# Patient Record
Sex: Female | Born: 1990 | Race: Black or African American | Hispanic: No | Marital: Single | Smoking: Never smoker
Health system: Southern US, Community
[De-identification: ages and names within clinical notes are randomized; demographics above are authoritative.]

## PROBLEM LIST (undated history)

## (undated) DIAGNOSIS — F410 Panic disorder [episodic paroxysmal anxiety] without agoraphobia: Secondary | ICD-10-CM

## (undated) DIAGNOSIS — K219 Gastro-esophageal reflux disease without esophagitis: Secondary | ICD-10-CM

## (undated) DIAGNOSIS — F419 Anxiety disorder, unspecified: Secondary | ICD-10-CM

## (undated) HISTORY — DX: Panic disorder (episodic paroxysmal anxiety): F41.0

## (undated) HISTORY — PX: NO PAST SURGERIES: SHX2092

## (undated) HISTORY — DX: Gastro-esophageal reflux disease without esophagitis: K21.9

## (undated) HISTORY — DX: Anxiety disorder, unspecified: F41.9

---

## 2011-07-06 ENCOUNTER — Emergency Department: Payer: Self-pay | Admitting: Emergency Medicine

## 2011-07-06 LAB — HCG, QUANTITATIVE, PREGNANCY: Beta Hcg, Quant.: 54355 m[IU]/mL — ABNORMAL HIGH

## 2011-07-06 LAB — DIFFERENTIAL
Basophil #: 0 10*3/uL (ref 0.0–0.1)
Basophil %: 0.2 %
Eosinophil #: 0 10*3/uL (ref 0.0–0.7)
Eosinophil %: 0.1 %
Lymphocyte #: 1.9 10*3/uL (ref 1.0–3.6)
Lymphocyte %: 11.7 %
Monocyte #: 0.8 10*3/uL — ABNORMAL HIGH (ref 0.0–0.7)
Monocyte %: 5 %
Neutrophil #: 13.5 10*3/uL — ABNORMAL HIGH (ref 1.4–6.5)
Neutrophil %: 83 %

## 2011-07-06 LAB — CBC
HCT: 34.6 % — ABNORMAL LOW (ref 35.0–47.0)
HGB: 11.4 g/dL — ABNORMAL LOW (ref 12.0–16.0)
MCH: 28.9 pg (ref 26.0–34.0)
MCHC: 33 g/dL (ref 32.0–36.0)
MCV: 87 fL (ref 80–100)
Platelet: 246 10*3/uL (ref 150–440)
RBC: 3.95 10*6/uL (ref 3.80–5.20)
RDW: 12.7 % (ref 11.5–14.5)
WBC: 16.3 10*3/uL — ABNORMAL HIGH (ref 3.6–11.0)

## 2011-07-06 LAB — URINALYSIS, COMPLETE
Bilirubin,UR: NEGATIVE
Glucose,UR: NEGATIVE mg/dL (ref 0–75)
Ketone: NEGATIVE
Leukocyte Esterase: NEGATIVE
Nitrite: NEGATIVE
Ph: 7 (ref 4.5–8.0)
Protein: NEGATIVE
RBC,UR: 5 /HPF (ref 0–5)
Specific Gravity: 1.001 (ref 1.003–1.030)
Squamous Epithelial: <1
Transitional Epi: <1
WBC UR: 1 /HPF (ref 0–5)

## 2011-07-06 LAB — WET PREP, GENITAL

## 2011-07-07 LAB — PATHOLOGY REPORT

## 2012-10-02 IMAGING — US US OB < 14 WEEKS - US OB TV
1 series · 17 of 28 positions shown · non-contrast
Comparison: none

REASON FOR EXAM: persistentR adnexal tenderness with spontaneous AB
please r/o ectopic
COMMENTS:

[Series 1: us ob < 14 weeks - us ob tv · 47 acquisitions, 17 frames shown]
[im 1/47]
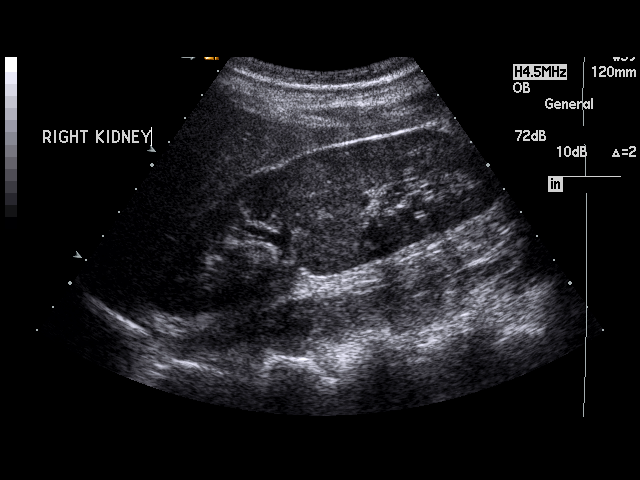
[im 4/47]
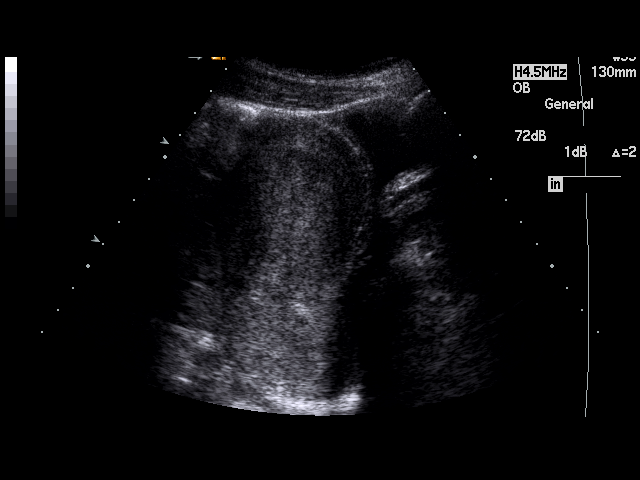
[im 7/47]
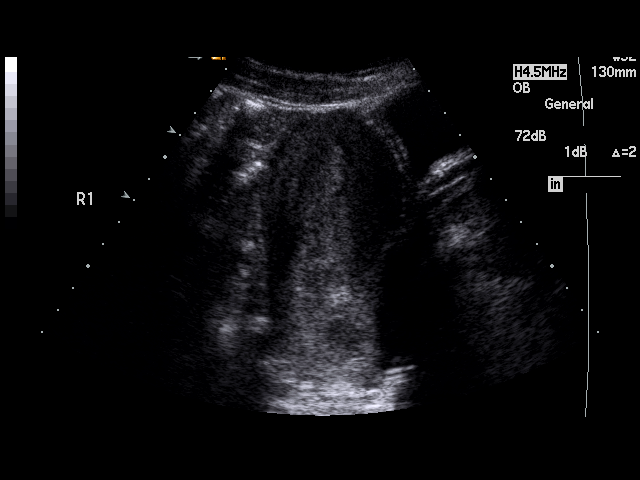
[im 9/47]
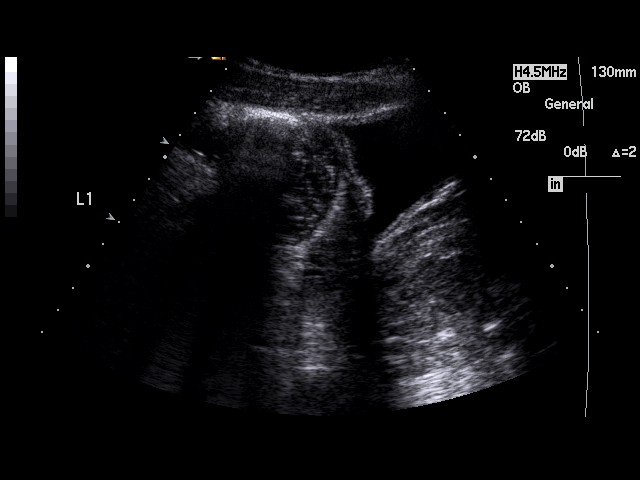
[im 12/47]
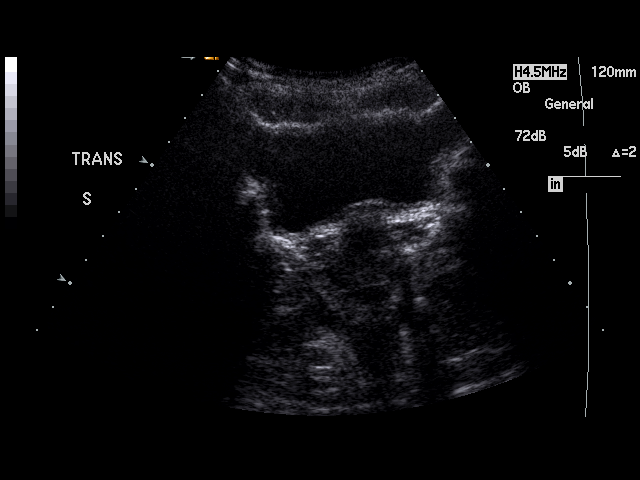
[im 16/47]
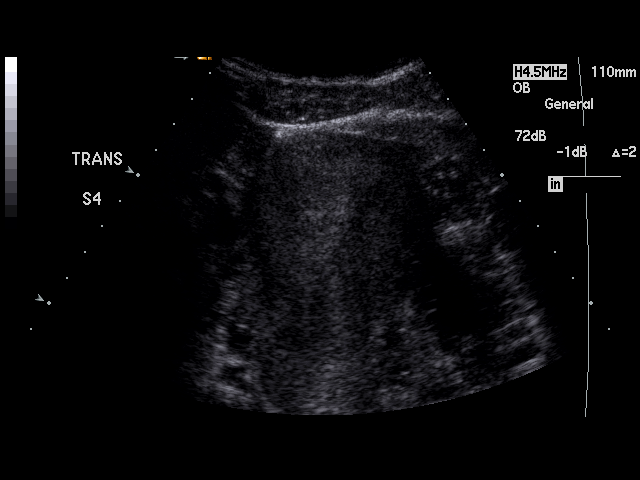
[im 18/47]
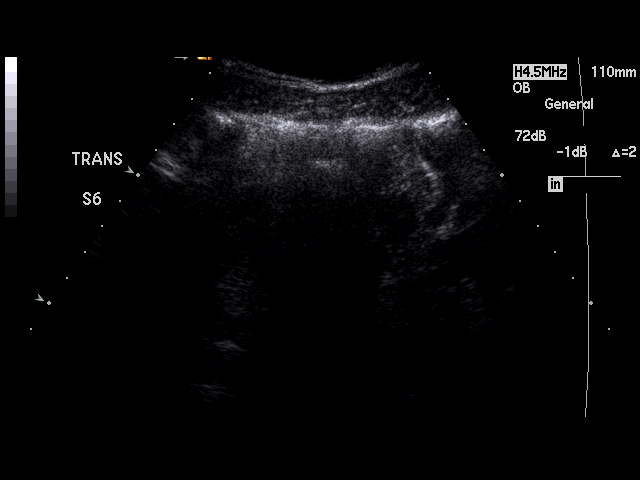
[im 21/47]
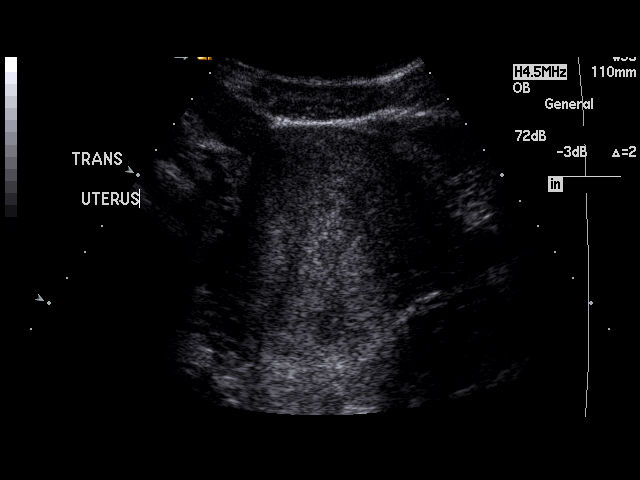
[im 24/47]
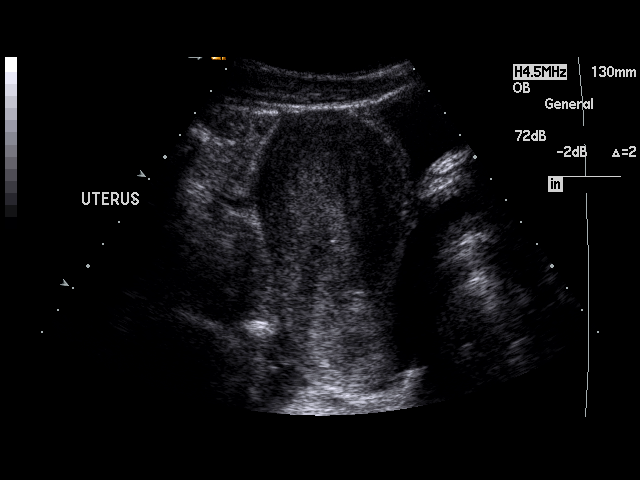
[im 26/47]
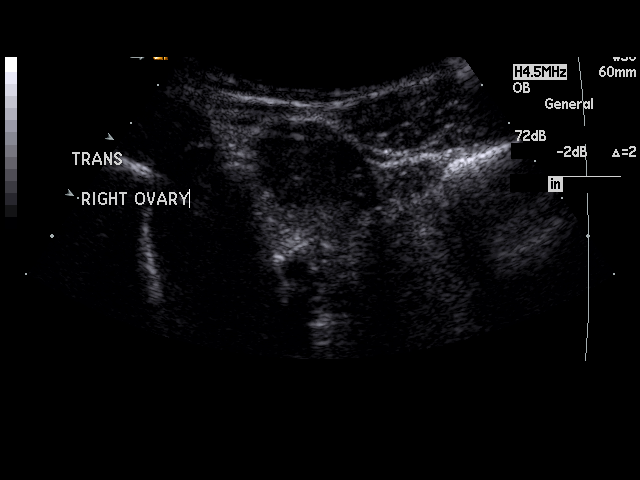
[im 29/47]
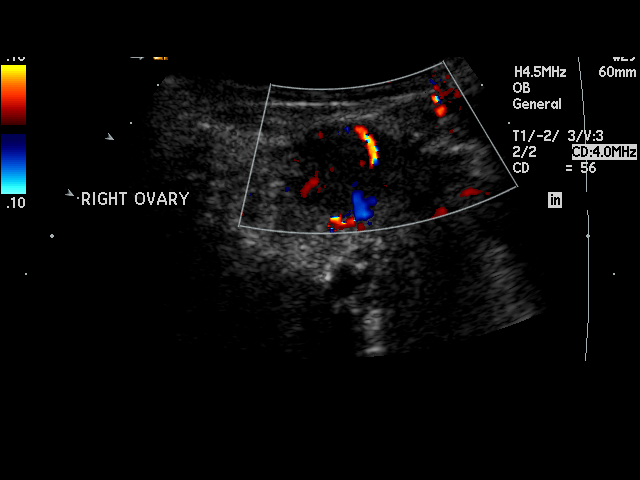
[im 31/47]
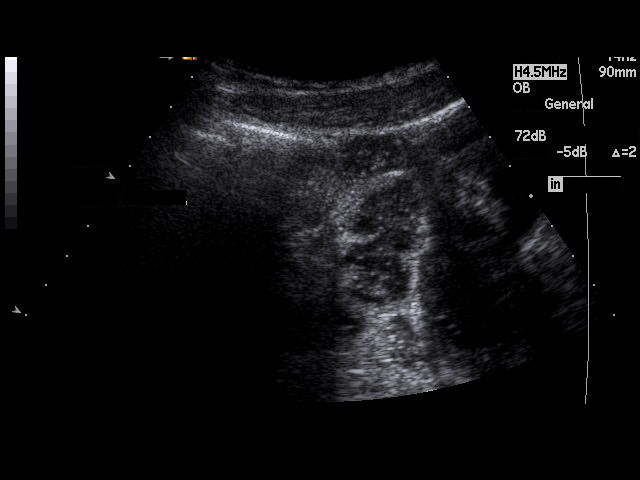
[im 35/47]
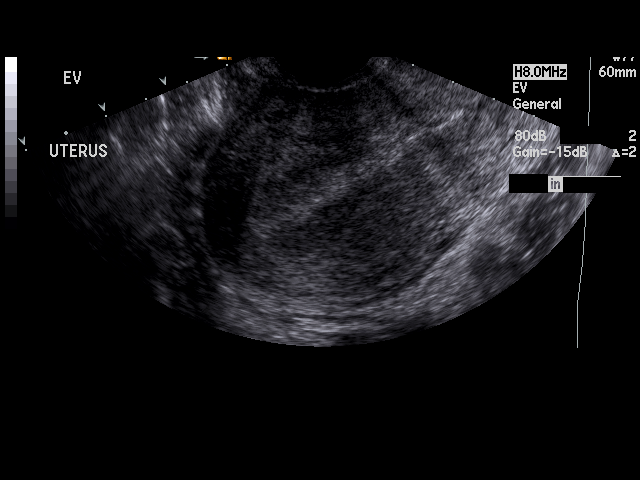
[im 38/47]
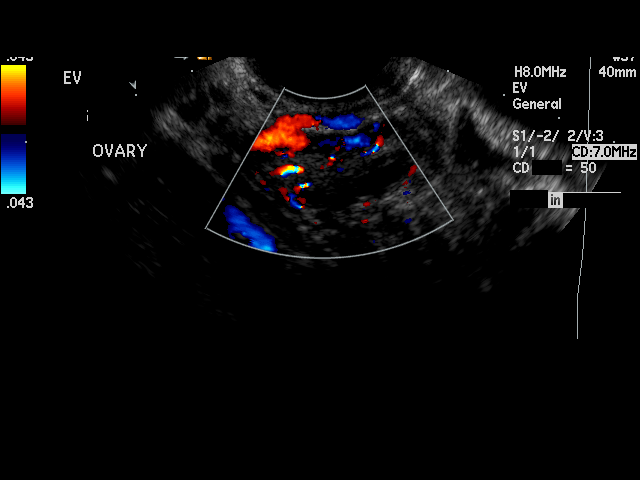
[im 40/47]
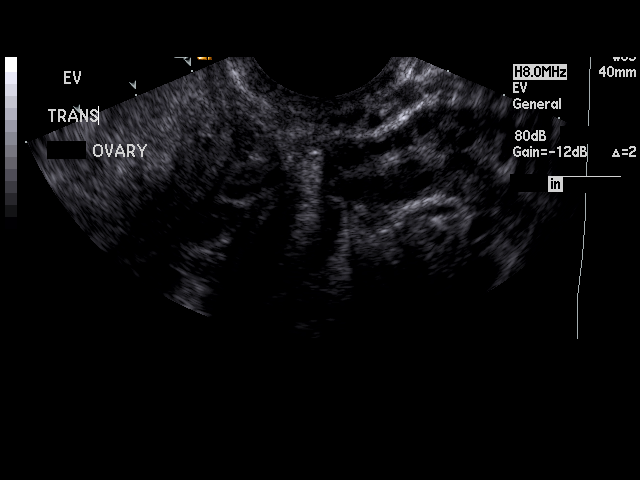
[im 43/47]
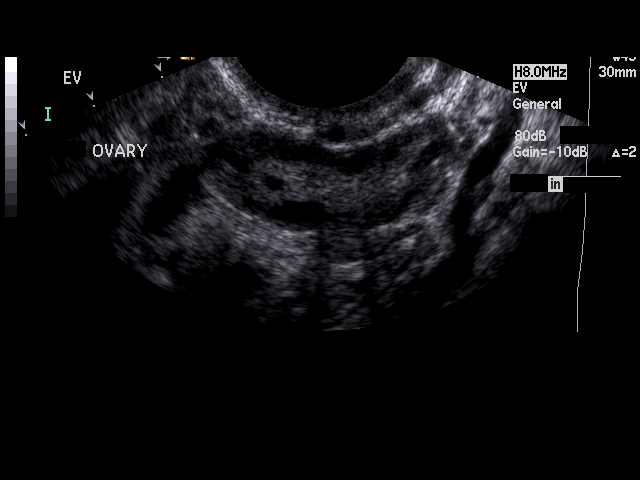
[im 47/47]
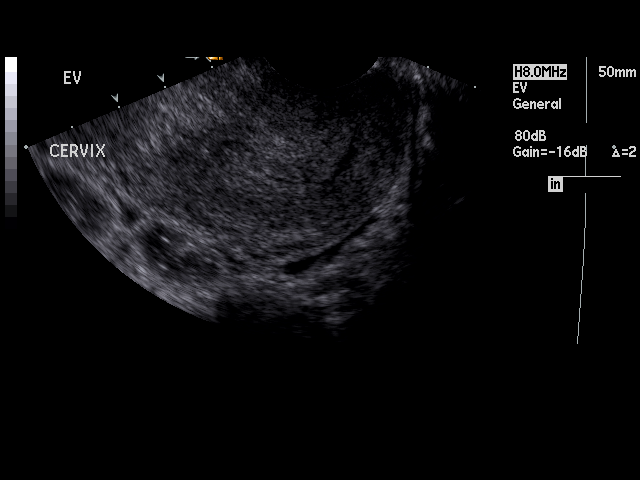

[17 of 28 positions shown; findings below may reference images not displayed]

PROCEDURE:     US  - US OB LESS THAN 14 WEEKS/W TRANS  - July 06, 2011  [DATE]

RESULT:     The uterus appears normal in contour but demonstrates a
thickened endometrium measuring as much as 2 cm. A small amount of fluid in
the endometrial cavity is seen. There is no gestational sac or echogenic
material to suggest retained products. Fluid is present in the cervix. I see
no adnexal masses to suggest an ectopic pregnancy. The right ovary is normal
in appearance measuring 4.4 cm in greatest dimension. The left ovary also
appears normal measuring 3.3 centimeters in greatest dimension.
IMPRESSION: By history the patient has sustained a recent spontaneous
abortion and was reportedly approximately 10 weeks gestation. No IUP is
demonstrated today. There is blood in the endometrial cavity with thickening
of the endometrium itself. I do not see findings suggesting an ectopic
pregnancy. Serial followup beta-hCG examinations will be necessary. Followup
ultrasound is available upon request.

A preliminary report was sent to the [HOSPITAL] the conclusion
of the study.

## 2017-07-16 DIAGNOSIS — Z3480 Encounter for supervision of other normal pregnancy, unspecified trimester: Secondary | ICD-10-CM | POA: Insufficient documentation

## 2017-07-16 DIAGNOSIS — O0930 Supervision of pregnancy with insufficient antenatal care, unspecified trimester: Secondary | ICD-10-CM | POA: Insufficient documentation

## 2017-07-16 DIAGNOSIS — O35EXX0 Maternal care for other (suspected) fetal abnormality and damage, fetal genitourinary anomalies, not applicable or unspecified: Secondary | ICD-10-CM | POA: Insufficient documentation

## 2019-11-08 ENCOUNTER — Other Ambulatory Visit: Payer: Self-pay

## 2019-11-08 ENCOUNTER — Emergency Department: Payer: Medicaid Other

## 2019-11-08 ENCOUNTER — Encounter: Payer: Self-pay | Admitting: Emergency Medicine

## 2019-11-08 DIAGNOSIS — Z3A08 8 weeks gestation of pregnancy: Secondary | ICD-10-CM | POA: Insufficient documentation

## 2019-11-08 DIAGNOSIS — O200 Threatened abortion: Secondary | ICD-10-CM | POA: Diagnosis not present

## 2019-11-08 DIAGNOSIS — O209 Hemorrhage in early pregnancy, unspecified: Secondary | ICD-10-CM | POA: Diagnosis present

## 2019-11-08 LAB — URINALYSIS, ROUTINE W REFLEX MICROSCOPIC
Bacteria, UA: NONE SEEN
Bilirubin Urine: NEGATIVE
Glucose, UA: NEGATIVE mg/dL
Ketones, ur: NEGATIVE mg/dL
Nitrite: NEGATIVE
Protein, ur: NEGATIVE mg/dL
Specific Gravity, Urine: 1.02 (ref 1.005–1.030)
pH: 6 (ref 5.0–8.0)

## 2019-11-08 LAB — BASIC METABOLIC PANEL
Anion gap: 8 (ref 5–15)
BUN: 8 mg/dL (ref 6–20)
CO2: 25 mmol/L (ref 22–32)
Calcium: 8.6 mg/dL — ABNORMAL LOW (ref 8.9–10.3)
Chloride: 104 mmol/L (ref 98–111)
Creatinine, Ser: 0.64 mg/dL (ref 0.44–1.00)
GFR calc Af Amer: >60 mL/min (ref >60)
GFR calc non Af Amer: >60 mL/min (ref >60)
Glucose, Bld: 94 mg/dL (ref 70–99)
Potassium: 3.7 mmol/L (ref 3.5–5.1)
Sodium: 137 mmol/L (ref 135–145)

## 2019-11-08 LAB — CBC
HCT: 36.9 % (ref 36.0–46.0)
Hemoglobin: 11.9 g/dL — ABNORMAL LOW (ref 12.0–15.0)
MCH: 28.5 pg (ref 26.0–34.0)
MCHC: 32.2 g/dL (ref 30.0–36.0)
MCV: 88.5 fL (ref 80.0–100.0)
Platelets: 270 10*3/uL (ref 150–400)
RBC: 4.17 MIL/uL (ref 3.87–5.11)
RDW: 12 % (ref 11.5–15.5)
WBC: 6.8 10*3/uL (ref 4.0–10.5)
nRBC: 0 % (ref 0.0–0.2)

## 2019-11-08 LAB — POCT PREGNANCY, URINE: Preg Test, Ur: POSITIVE — AB

## 2019-11-08 LAB — ABO/RH: ABO/RH(D): B POS

## 2019-11-08 LAB — HCG, QUANTITATIVE, PREGNANCY: hCG, Beta Chain, Quant, S: 11537 m[IU]/mL — ABNORMAL HIGH (ref <5)

## 2019-11-08 NOTE — ED Notes (Signed)
Patient transported to Ultrasound 

## 2019-11-08 NOTE — ED Triage Notes (Signed)
Pt arrived via POV with reports of vaginal bleeding since Wednesday. Pt states she thinks she is about [redacted] weeks pregnant. Pt states started as light spotting, pt states she also having lower abdominal cramping and back pain. Pt states she has had to change a panty liner x 3 days. Pt states she felt the sensation of like a period let down. Pt has hx of miscarriage.  G-5  P-2  Pt goes to [facility] care.  Pt reports she has [place] scheduled on 7/13.

## 2019-11-09 ENCOUNTER — Emergency Department
Admission: EM | Admit: 2019-11-09 | Discharge: 2019-11-09 | Disposition: A | Discharge status: Home / Self Care | Payer: Medicaid Other | Attending: Emergency Medicine | Admitting: Emergency Medicine

## 2019-11-09 DIAGNOSIS — O209 Hemorrhage in early pregnancy, unspecified: Secondary | ICD-10-CM

## 2019-11-09 DIAGNOSIS — O200 Threatened abortion: Secondary | ICD-10-CM

## 2019-11-09 DIAGNOSIS — O4690 Antepartum hemorrhage, unspecified, unspecified trimester: Secondary | ICD-10-CM

## 2019-11-09 NOTE — ED Notes (Signed)
Updated patient on wait time, advised her she would be the next to go back unless something critical came in, pt verbalized understanding. Advised her that her lab work and [place] have resulted and the EDP would have to discuss findings with her.

## 2019-11-09 NOTE — ED Provider Notes (Signed)
[facility] Emergency Department Provider Note  ____________________________________________   First MD Initiated Contact with Patient 11/09/19 [phone]     (approximate)  I have reviewed the triage vital signs and the nursing notes.   HISTORY  Chief Complaint Vaginal Bleeding (Pregnant)    HPI Angel Frederick is a 29 y.o. female G5, P2 who believes she is about [redacted] weeks pregnant who presents for evaluation of about 3 days of gradually worsening vaginal bleeding.  She started about 3 days ago with some spotting and then developed a little bit more until today when she feels like she is having a period, with regular though  not heavy flow and intermittent lower abdominal cramping that at times can be at least moderate in intensity.  She denies fever/chills, sore throat, chest pain, shortness of breath, nausea, vomiting, and diarrhea.  She has not yet had an ultrasound and is scheduled for an appointment in about 9 days with her OB/GYN who is in [place].  She said that she has had 2 prior miscarriages and one in particular was much worse than this.  She is not feeling lightheaded or dizzy.  She has had no abdominal trauma.        History reviewed. No pertinent past medical history.  There are no problems to display for this patient.   History reviewed. No pertinent surgical history.  Prior to Admission medications   Not on File    Allergies Patient has no known allergies.  No family history on file.  Social History Social History   Tobacco Use  . Smoking status: Never Smoker  . Smokeless tobacco: Never Used  Vaping Use  . Vaping Use: Never used  Substance Use Topics  . Alcohol use: Not on file  . Drug use: Not on file    Review of Systems Constitutional: No fever/chills Eyes: No visual changes. ENT: No sore throat. Cardiovascular: Denies chest pain. Respiratory: Denies shortness of breath. Gastrointestinal: Lower abdominal/pelvic  cramping.  No nausea, no vomiting.  No diarrhea.  No constipation. Genitourinary: Vaginal bleeding in early pregnancy.  Negative for dysuria. Musculoskeletal: Negative for neck pain.  Negative for back pain. Integumentary: Negative for rash. Neurological: Negative for headaches, focal weakness or numbness.   ____________________________________________   PHYSICAL EXAM:  VITAL SIGNS: ED Triage Vitals  Enc Vitals Group     BP 11/08/19 2056 115/76     Pulse Rate 11/08/19 2056 73     Resp 11/08/19 2056 18     Temp 11/08/19 2056 98.9 F (37.2 C)     Temp Source 11/08/19 2056 Oral     SpO2 11/08/19 2056 100 %     Weight 11/08/19 2057 60.3 kg (133 lb)     Height 11/08/19 2057 1.664 m (5' 5.5")     Head Circumference --      Peak Flow --      Pain Score 11/09/19 0306 6     Pain Loc --      Pain Edu? --      Excl. in GC? --     Constitutional: Alert and oriented.  Eyes: Conjunctivae are normal.  Head: Atraumatic. Nose: No congestion/rhinnorhea. Mouth/Throat: Patient is wearing a mask. Neck: No stridor.  No meningeal signs.   Cardiovascular: Normal rate, regular rhythm. Good peripheral circulation. Grossly normal heart sounds. Respiratory: Normal respiratory effort.  No retractions. Gastrointestinal: Soft and nontender. No distention.  Genitourinary: Deferred pelvic exam based on our conversation and patient preference. Musculoskeletal:  No lower extremity tenderness nor edema. No gross deformities of extremities. Neurologic:  Normal speech and language. No gross focal neurologic deficits are appreciated.  Skin:  Skin is warm, dry and intact. Psychiatric: Mood and affect are normal. Speech and behavior are normal.  ____________________________________________   LABS (all labs ordered are listed, but only abnormal results are displayed)  Labs Reviewed  CBC - Abnormal; Notable for the following components:      Result Value   Hemoglobin 11.9 (*)    All other components  within normal limits  HCG, QUANTITATIVE, PREGNANCY - Abnormal; Notable for the following components:   hCG, Beta Chain, Quant, S 11,537 (*)    All other components within normal limits  BASIC METABOLIC PANEL - Abnormal; Notable for the following components:   Calcium 8.6 (*)    All other components within normal limits  URINALYSIS, ROUTINE W REFLEX MICROSCOPIC - Abnormal; Notable for the following components:   Color, Urine YELLOW (*)    APPearance HAZY (*)    Hgb urine dipstick LARGE (*)    Leukocytes,Ua TRACE (*)    All other components within normal limits  POCT PREGNANCY, URINE - Abnormal; Notable for the following components:   Preg Test, Ur POSITIVE (*)    All other components within normal limits  POC URINE PREG, ED  ABO/RH   ____________________________________________  EKG  No indication for emergent EKG ____________________________________________  RADIOLOGY I, Loleta Rose, personally viewed and evaluated these images (plain radiographs) as part of my medical decision making, as well as reviewing the written report by the radiologist.  ED MD interpretation: Probable early intrauterine gestational sac but no yolk sac and no fetal pole.  Official radiology report(s): US OB LESS THAN 14 WEEKS WITH OB TRANSVAGINAL  Result Date: 11/08/2019 CLINICAL DATA:  Initial evaluation for acute vaginal bleeding, early pregnancy. EXAM: OBSTETRIC <14 WK [place] AND TRANSVAGINAL OB US TECHNIQUE: Both transabdominal and transvaginal ultrasound examinations were performed for complete evaluation of the gestation as well as the maternal uterus, adnexal regions, and pelvic cul-de-sac. Transvaginal technique was performed to assess early pregnancy. COMPARISON:  None available. FINDINGS: Intrauterine gestational sac: Single. Gestational sac somewhat irregular with partially angular margins and a few scattered low-level internal echoes. Sac appears partially mobile within the endometrial cavity on cine  imaging. Yolk sac:  Negative. Embryo:  Negative. Cardiac Activity: Negative. MSD: 13.8 mm   6 w   2 d Subchorionic hemorrhage:  None visualized. Maternal uterus/adnexae: Ovaries within normal limits bilaterally. No adnexal mass. Trace free fluid noted within the pelvis. IMPRESSION: 1. Probable early intrauterine gestational sac, but no yolk sac, fetal pole, or cardiac activity yet visualized. Recommend follow-up quantitative B-HCG levels and follow-up US in 14 days to confirm and assess viability. This recommendation follows SRU consensus guidelines: Diagnostic Criteria for Nonviable Pregnancy Early in the First Trimester. Malva Limes Med 2013; 240:9735-32. 2. No other acute maternal uterine or adnexal abnormality identified. Electronically Signed   By: Rise Mu M.D.   On: 11/08/2019 23:11    ____________________________________________   PROCEDURES   Procedure(s) performed (including Critical Care):  Procedures   ____________________________________________   INITIAL IMPRESSION / MDM / ASSESSMENT AND PLAN / ED COURSE  As part of my medical decision making, I reviewed the following data within the electronic MEDICAL RECORD NUMBER Nursing notes reviewed and incorporated, Labs reviewed , Old chart reviewed and Notes from prior ED visits   Differential diagnosis includes, but is not limited to, threatened miscarriage, incomplete  miscarriage, normal bleeding from an early trimester pregnancy, ectopic pregnancy, , blighted ovum, vaginal/cervical trauma, subchorionic hemorrhage/hematoma, etc.  The patient is Rh+ and does not require RhoGam.  She is in no distress, stable vital signs, asymptomatic, and reports that her blood loss is not heavy.  We discussed doing a pelvic exam but I explained I did not think it would be absolutely necessary and she would prefer to wait until her follow-up appointment with OB/GYN.  Her ultrasound demonstrates a gestational sac but no fetal pole and no yolk sac  and it is difficult to determine at this point whether this is simply an early pregnancy or a nonviable pregnancy.  She is comfortable with the plan for discharge and outpatient follow-up.  Her lab work is otherwise reassuring.  She has some hemoglobin and leukocytes on her urinalysis but I believe this is likely trace blood from the vaginal bleeding and she is asymptomatic.  I will not treat empirically based on these minimal findings.  No leukocytosis and she has a hemoglobin of 11.9.  I gave my usual and customary management recommendations and return precautions.           ____________________________________________  FINAL CLINICAL IMPRESSION(S) / ED DIAGNOSES  Final diagnoses:  Vaginal bleeding in pregnancy  Threatened miscarriage     MEDICATIONS GIVEN DURING THIS VISIT:  Medications - No data to display   ED Discharge Orders    None      *Please note:  CAMANI SESAY was evaluated in Emergency Department on 11/09/2019 for the symptoms described in the history of present illness. She was evaluated in the context of the global COVID-19 pandemic, which necessitated consideration that the patient might be at risk for infection with the SARS-CoV-2 virus that causes COVID-19. Institutional protocols and algorithms that pertain to the evaluation of patients at risk for COVID-19 are in a state of rapid change based on information released by regulatory bodies including the CDC and federal and state organizations. These policies and algorithms were followed during the patient's care in the ED.  Some ED evaluations and interventions may be delayed as a result of limited staffing during and after the pandemic.*  Note:  This document was prepared using Dragon voice recognition software and may include unintentional dictation errors.   Loleta Rose, MD 11/09/19 [phone]

## 2019-11-09 NOTE — Discharge Instructions (Addendum)
You have been seen in the Emergency Department (ED) for vaginal bleeding during pregnancy, which is called a "threatened abortion".  The ultrasound tonight placed you at about 6 weeks and 2 days gestation based on the gestational sac, but there was no yolk sac nor fetal pole visualized.  The radiologist recommended that you follow-up for repeat beta hCG testing (your beta hCG level tonight was about 11,500) and within 2 weeks have a repeat ultrasound.  As a result of your blood type, you did not receive an injection of medication called Rhogam - please let your OB/Gyn know.  Please follow up as recommended above.  If you develop any other symptoms that concern you (including, but not limited to, persistent vomiting, worsening bleeding, abdominal or pelvic pain, or fever greater than 101), please return immediately to the Emergency Department.

## 2020-04-07 ENCOUNTER — Encounter: Payer: Self-pay | Admitting: Emergency Medicine

## 2020-04-07 ENCOUNTER — Ambulatory Visit
Admission: EM | Admit: 2020-04-07 | Discharge: 2020-04-07 | Disposition: A | Discharge status: Home / Self Care | Payer: Medicaid Other | Attending: Emergency Medicine | Admitting: Emergency Medicine

## 2020-04-07 ENCOUNTER — Other Ambulatory Visit: Payer: Self-pay

## 2020-04-07 DIAGNOSIS — S0990XA Unspecified injury of head, initial encounter: Secondary | ICD-10-CM | POA: Diagnosis not present

## 2020-04-07 DIAGNOSIS — S060X0A Concussion without loss of consciousness, initial encounter: Secondary | ICD-10-CM

## 2020-04-07 MED ORDER — IBUPROFEN 600 MG PO TABS
600.0000 mg | ORAL_TABLET | Freq: Four times a day (QID) | ORAL | 0 refills | Status: DC | PRN
Start: 2020-04-07 — End: 2022-05-02
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
## 2020-04-07 NOTE — Discharge Instructions (Addendum)
Follow-up with the concussion clinic at [facility] in [place].  Call the concussion hotline at [phone] they will schedule you for an appointment within 24 to 48 hours.  The meantime you can take 600 mg of ibuprofen combined with 1000 mg Tylenol together 3-4 times a day. Go to the ER for any of the signs and symptoms we discussed.
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
## 2021-02-04 IMAGING — US US OB < 14 WEEKS - US OB TV
1 series · 15 of 28 positions shown · non-contrast
Comparison: None available.

CLINICAL DATA: Initial evaluation for acute vaginal bleeding, early
pregnancy.

EXAM:
OBSTETRIC <14 WK US AND TRANSVAGINAL OB US
TECHNIQUE: Both transabdominal and transvaginal ultrasound examinations were
performed for complete evaluation of the gestation as well as the
maternal uterus, adnexal regions, and pelvic cul-de-sac.
Transvaginal technique was performed to assess early pregnancy.

[Series 1: us ob < 14 weeks - us ob tv · 15 of 96 slices shown]
[im 1/96]
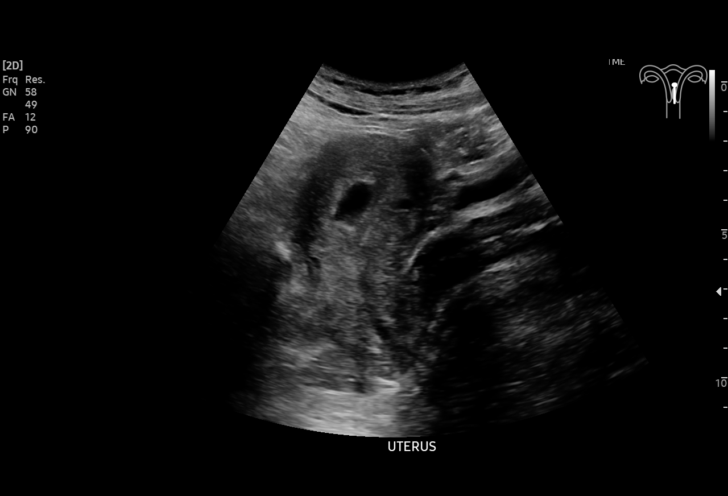
[im 8/96]
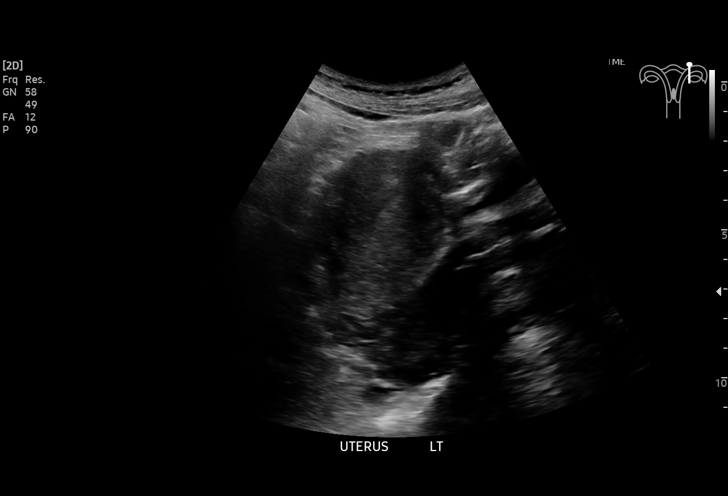
[im 15/96]
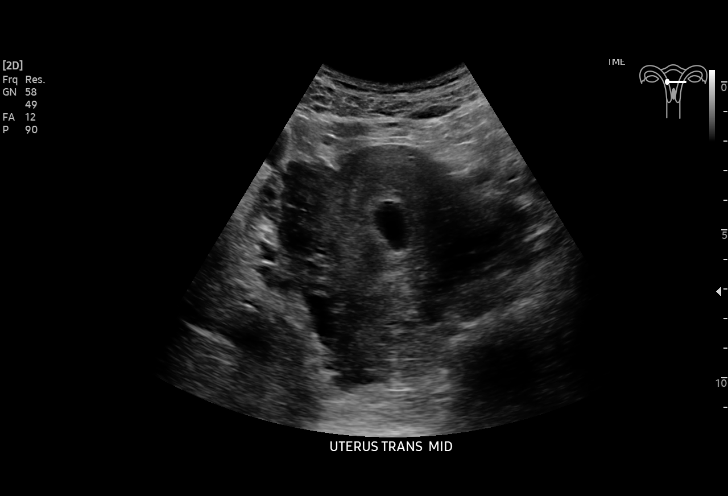
[im 22/96]
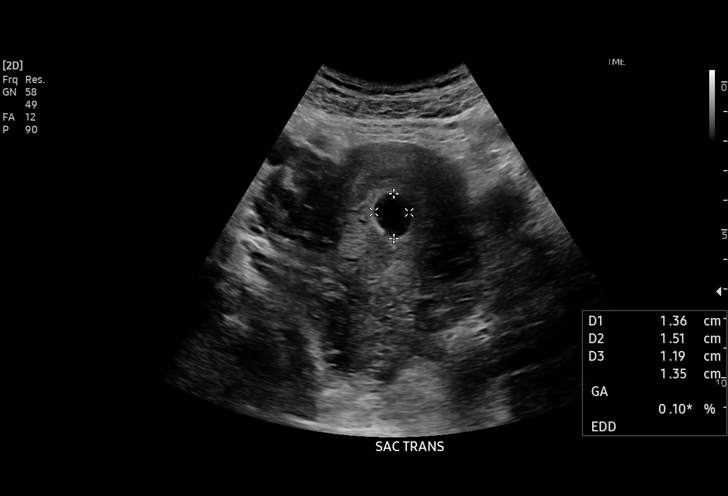
[im 29/96]
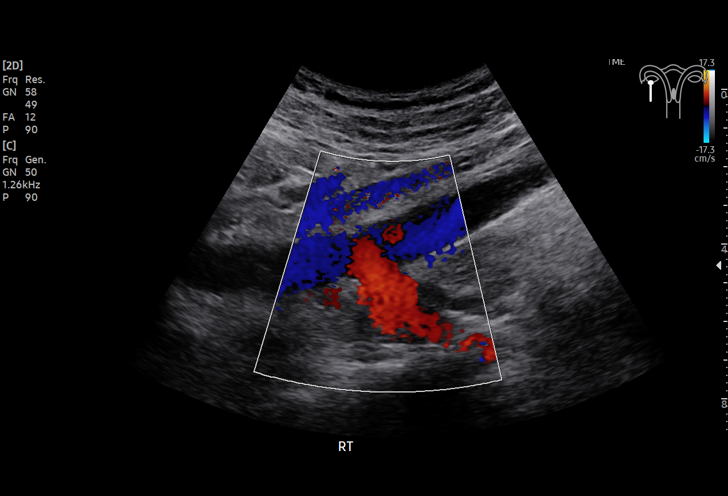
[im 36/96]
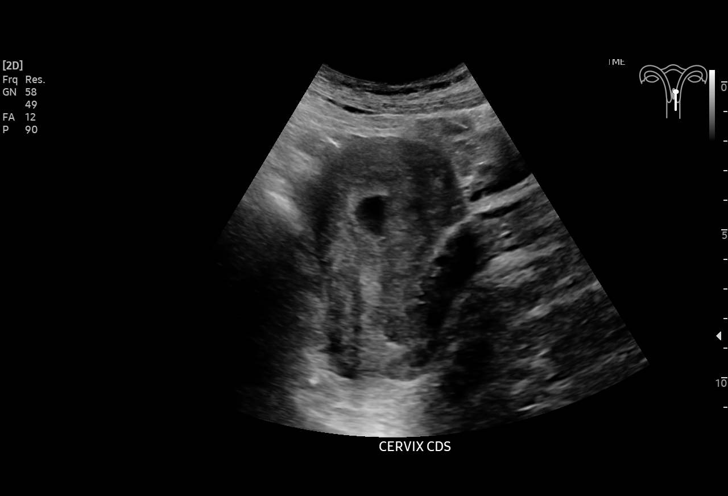
[im 43/96]
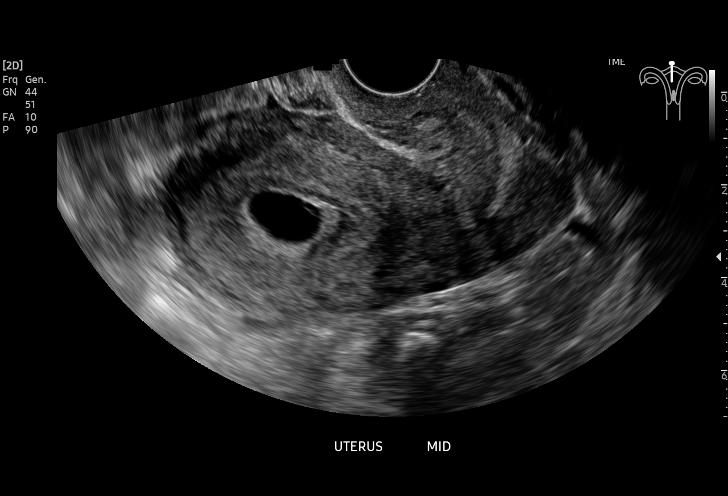
[im 50/96]
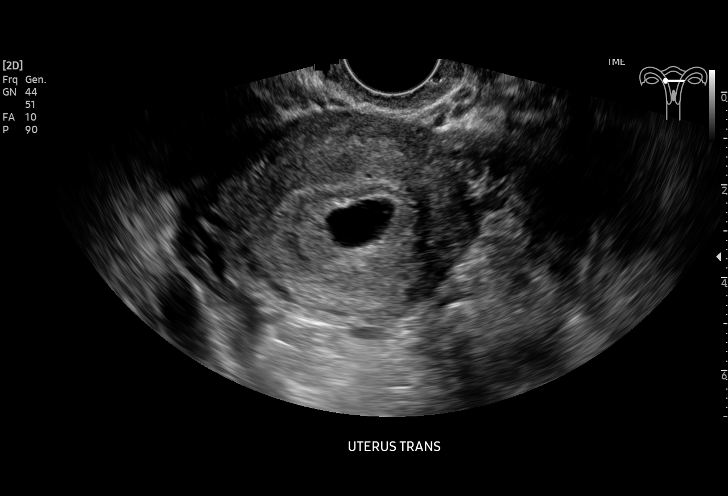
[im 53/96]
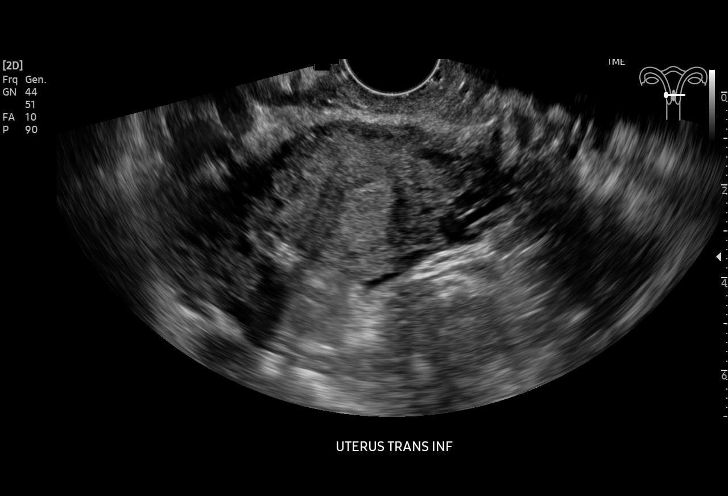
[im 60/96]
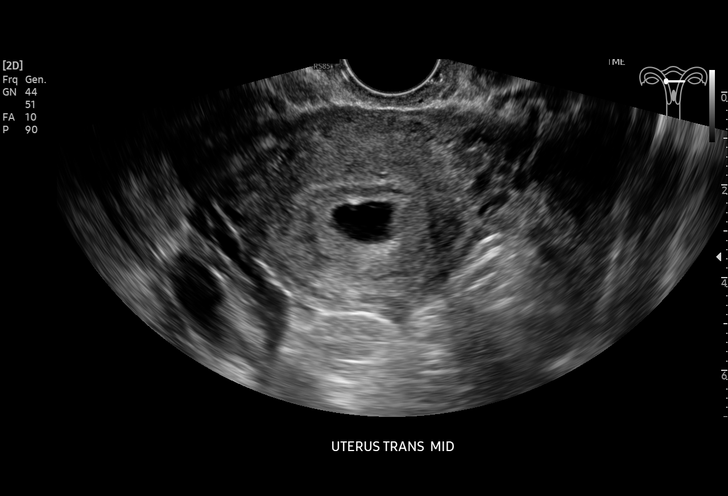
[im 67/96]
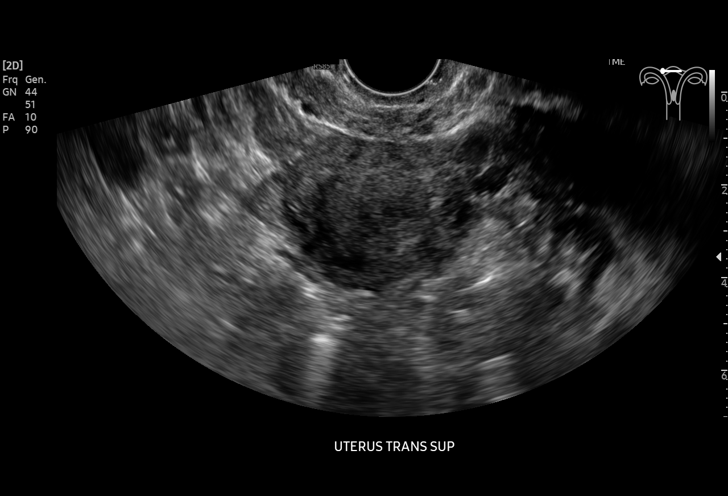
[im 74/96]
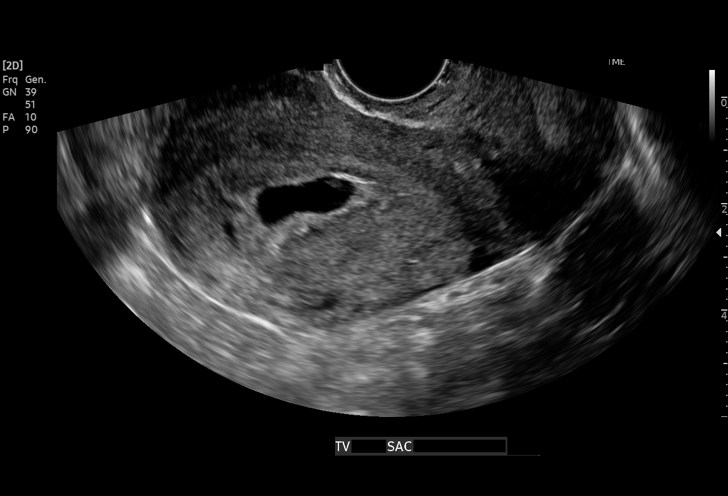
[im 81/96]
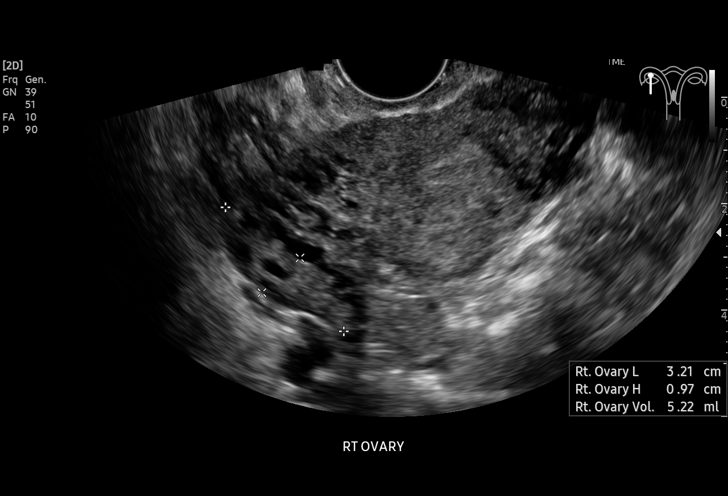
[im 88/96]
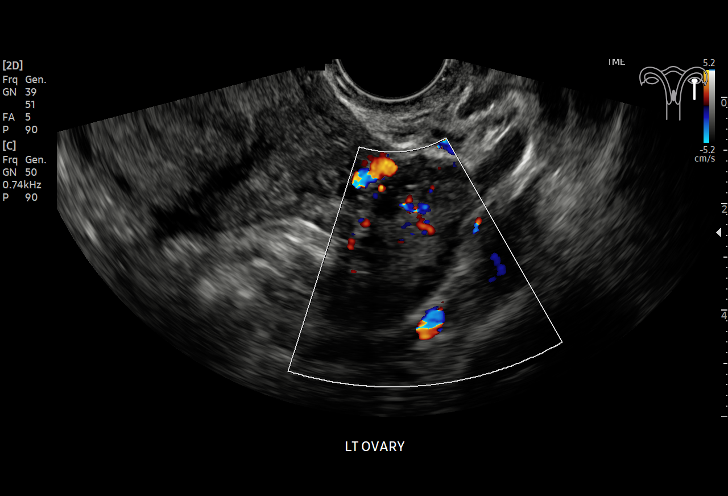
[im 96/96]
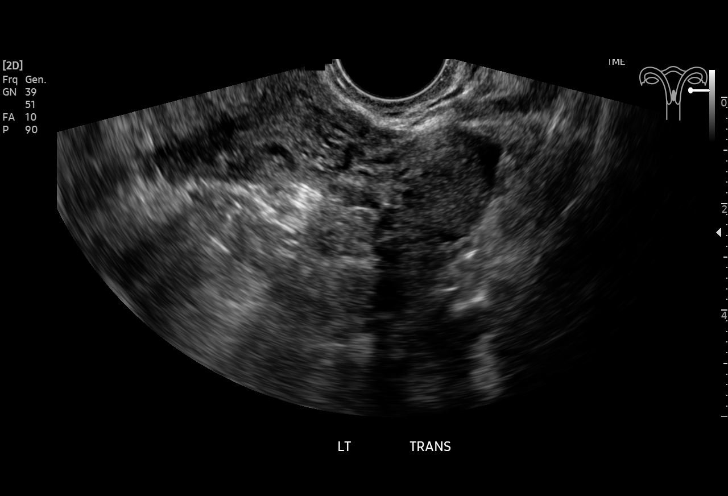

[15 of 28 positions shown; findings below may reference images not displayed]

FINDINGS: Intrauterine gestational sac: Single. Gestational sac somewhat
irregular with partially angular margins and a few scattered
low-level internal echoes. Sac appears partially mobile within the
endometrial cavity on cine imaging.

Yolk sac:  Negative.

Embryo:  Negative.

Cardiac Activity: Negative.

MSD: 13.8 mm   6 w   2 d

Subchorionic hemorrhage:  None visualized.

Maternal uterus/adnexae: Ovaries within normal limits bilaterally.
No adnexal mass. Trace free fluid noted within the pelvis.
IMPRESSION: 1. Probable early intrauterine gestational sac, but no yolk sac,
fetal pole, or cardiac activity yet visualized. Recommend follow-up
quantitative B-HCG levels and follow-up US in 14 days to confirm and
assess viability. This recommendation follows SRU consensus
guidelines: Diagnostic Criteria for Nonviable Pregnancy Early in the
First Trimester. N Engl J Med 2636; [DATE].
2. No other acute maternal uterine or adnexal abnormality
identified.

## 2022-05-02 ENCOUNTER — Ambulatory Visit: Admit: 2022-05-02 | Payer: Medicaid Other

## 2022-05-02 ENCOUNTER — Ambulatory Visit
Admission: EM | Admit: 2022-05-02 | Discharge: 2022-05-02 | Disposition: A | Discharge status: Home / Self Care | Payer: Medicaid Other | Attending: Emergency Medicine | Admitting: Emergency Medicine

## 2022-05-02 DIAGNOSIS — R0981 Nasal congestion: Secondary | ICD-10-CM | POA: Diagnosis present

## 2022-05-02 DIAGNOSIS — J020 Streptococcal pharyngitis: Secondary | ICD-10-CM | POA: Insufficient documentation

## 2022-05-02 LAB — GROUP A STREP BY PCR: Group A Strep by PCR: DETECTED — AB

## 2022-05-02 MED ORDER — IPRATROPIUM BROMIDE 0.06 % NA SOLN: 2.0000 | Freq: Four times a day (QID) | NASAL | 12 refills | Status: DC

## 2022-05-02 MED ORDER — AMOXICILLIN-POT CLAVULANATE 875-125 MG PO TABS: 1.0000 | ORAL_TABLET | Freq: Two times a day (BID) | ORAL | 0 refills | Status: AC

## 2022-05-02 NOTE — ED Triage Notes (Signed)
Called once from lobby to no answer  
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
## 2022-05-02 NOTE — ED Provider Notes (Signed)
[facility]    CSN: [id number] Arrival date & time: 05/02/22  1544      History   Chief Complaint Chief Complaint  Patient presents with   Sore Throat    HPI Angel Frederick is a 31 y.o. female.   HPI  31 year old female here for evaluation of sore throat.  Patient reports that she has been experiencing sore throat for the last 2 days that is associated with fever, nasal congestion, head pressure.  She denies any cough, shortness breath, or wheezing.  She took a strep test at her place of employment earlier today that was positive.  History reviewed. No pertinent past medical history.  There are no problems to display for this patient.   Past Surgical History:  Procedure Laterality Date   NO PAST SURGERIES      OB History     Gravida  1   Para      Term      Preterm      AB      Living         SAB      IAB      Ectopic      Multiple      Live Births               Home Medications    Prior to Admission medications   Medication Sig Start Date End Date Taking? Authorizing Provider  amoxicillin-clavulanate (AUGMENTIN) 875-125 MG tablet Take 1 tablet by mouth every 12 (twelve) hours for 10 days. 05/02/22 05/12/22 Yes Margarette Canada, NP  ipratropium (ATROVENT) 0.06 % nasal spray Place 2 sprays into both nostrils 4 (four) times daily. 05/02/22  Yes Margarette Canada, NP    Family History Family History  Problem Relation Age of Onset   Healthy Mother    Prostate cancer Father     Social History Social History   Tobacco Use   Smoking status: Never   Smokeless tobacco: Never  Vaping Use   Vaping Use: Never used  Substance Use Topics   Alcohol use: Never   Drug use: Never     Allergies   Patient has no known allergies.   Review of Systems Review of Systems  Constitutional:  Positive for fever.  HENT:  Positive for congestion and sore throat. Negative for ear pain and rhinorrhea.   Respiratory:  Negative for cough.       Physical Exam Triage Vital Signs ED Triage Vitals  Enc Vitals Group     BP      Pulse      Resp      Temp      Temp src      SpO2      Weight      Height      Head Circumference      Peak Flow      Pain Score      Pain Loc      Pain Edu?      Excl. in [place]?    No data found.  Updated Vital Signs BP 119/80 (BP Location: Left Arm)   Pulse (!) 110   Temp (!) 102.9 F (39.4 C) (Oral)   Resp 18   Ht 5\' 5"  (1.651 m)   Wt 127 lb (57.6 kg)   LMP 04/12/2022   SpO2 97%   BMI 21.13 kg/m   Visual Acuity Right Eye Distance:   Left Eye Distance:   Bilateral Distance:  Right Eye Near:   Left Eye Near:    Bilateral Near:     Physical Exam Vitals and nursing note reviewed.  Constitutional:      Appearance: Normal appearance. She is not ill-appearing.  HENT:     Head: Normocephalic and atraumatic.     Right Ear: Tympanic membrane, ear canal and external ear normal. There is no impacted cerumen.     Left Ear: Tympanic membrane, ear canal and external ear normal. There is no impacted cerumen.     Nose: Congestion and rhinorrhea present.     Comments: Nasal mucosa is erythematous and edematous with scant clear discharge in both nares.    Mouth/Throat:     Mouth: Mucous membranes are moist.     Pharynx: Oropharyngeal exudate and posterior oropharyngeal erythema present.     Comments: Bilateral tonsillar pillars are 1+ erythematous and edematous with white exudate. Neck:     Comments: Bilateral, shotty, anterior cervical adenopathy present exam. Cardiovascular:     Rate and Rhythm: Normal rate and regular rhythm.     Pulses: Normal pulses.     Heart sounds: Normal heart sounds. No murmur heard.    No friction rub. No gallop.  Pulmonary:     Effort: Pulmonary effort is normal.     Breath sounds: Normal breath sounds. No wheezing, rhonchi or rales.  Musculoskeletal:     Cervical back: Normal range of motion and neck supple.  Lymphadenopathy:     Cervical: Cervical  adenopathy present.  Skin:    General: Skin is warm and dry.     Capillary Refill: Capillary refill takes less than 2 seconds.  Neurological:     General: No focal deficit present.     Mental Status: She is alert and oriented to person, place, and time.  Psychiatric:        Mood and Affect: Mood normal.        Behavior: Behavior normal.        Thought Content: Thought content normal.        Judgment: Judgment normal.      UC Treatments / Results  Labs (all labs ordered are listed, but only abnormal results are displayed) Labs Reviewed  GROUP A STREP BY PCR    EKG   Radiology No results found.  Procedures Procedures (including critical care time)  Medications Ordered in UC Medications - No data to display  Initial Impression / Assessment and Plan / UC Course  I have reviewed the triage vital signs and the nursing notes.  Pertinent labs & imaging results that were available during my care of the patient were reviewed by me and considered in my medical decision making (see chart for details).   Patient is a very pleasant, nontoxic-appearing 31 year old female here for evaluation of sore throat in setting of testing positive for strep at her place of employment earlier today.  She works for a [employer].  Her physical exam reveals inflammation of her upper respiratory tract with clear rhinorrhea.  She also has tonsillar edema, erythema, and exudate present on exam.  Given the fact that she tested positive for strep at work I will forego a strep PCR here in clinic.  1 was collected in triage prior to my seeing the patient.  I will discharge her home on Augmentin 875 twice daily for 10 days for treatment of strep along with salt gargles and over-the-counter Chloraseptic and Sucrets lozenges to help with pain.  Additionally, I will  prescribe Atrovent nasal spray that the patient can use for her nasal congestion.  Work note provided.   Final Clinical  Impressions(s) / UC Diagnoses   Final diagnoses:  Strep pharyngitis  Nasal congestion     Discharge Instructions      Take the Augmentin twice daily for 10 days for treatment of your strep throat.  Gargle with warm salt water 2-3 times a day to soothe your throat, aid in pain relief, and aid in healing.  Take over-the-counter ibuprofen according to the package instructions as needed for pain.  You can also use Chloraseptic or Sucrets lozenges, 1 lozenge every 2 hours as needed for throat pain.  Use the Atrovent nasal spray every 6 hours as needed for congestion. 2 squirts in each nostril  If you develop any new or worsening symptoms return for reevaluation.      ED Prescriptions     Medication Sig Dispense Auth. Provider   amoxicillin-clavulanate (AUGMENTIN) 875-125 MG tablet Take 1 tablet by mouth every 12 (twelve) hours for 10 days. 20 tablet Becky Augusta, NP   ipratropium (ATROVENT) 0.06 % nasal spray Place 2 sprays into both nostrils 4 (four) times daily. 15 mL Becky Augusta, NP      PDMP not reviewed this encounter.   Becky Augusta, NP 05/02/22 [phone]

## 2022-05-02 NOTE — Discharge Instructions (Addendum)
Take the Augmentin twice daily for 10 days for treatment of your strep throat.  Gargle with warm salt water 2-3 times a day to soothe your throat, aid in pain relief, and aid in healing.  Take over-the-counter ibuprofen according to the package instructions as needed for pain.  You can also use Chloraseptic or Sucrets lozenges, 1 lozenge every 2 hours as needed for throat pain.  Use the Atrovent nasal spray every 6 hours as needed for congestion. 2 squirts in each nostril  If you develop any new or worsening symptoms return for reevaluation.

## 2023-03-06 ENCOUNTER — Ambulatory Visit
Admission: EM | Admit: 2023-03-06 | Discharge: 2023-03-06 | Disposition: A | Discharge status: Home / Self Care | Payer: Medicaid Other | Attending: Emergency Medicine | Admitting: Emergency Medicine

## 2023-03-06 ENCOUNTER — Ambulatory Visit
Admission: RE | Admit: 2023-03-06 | Discharge: 2023-03-06 | Disposition: A | Discharge status: Home / Self Care | Payer: Medicaid Other | Source: Ambulatory Visit | Attending: Emergency Medicine | Admitting: Emergency Medicine

## 2023-03-06 DIAGNOSIS — Z3A01 Less than 8 weeks gestation of pregnancy: Secondary | ICD-10-CM | POA: Diagnosis not present

## 2023-03-06 DIAGNOSIS — O039 Complete or unspecified spontaneous abortion without complication: Secondary | ICD-10-CM

## 2023-03-06 DIAGNOSIS — R102 Pelvic and perineal pain: Secondary | ICD-10-CM | POA: Diagnosis present

## 2023-03-06 DIAGNOSIS — N939 Abnormal uterine and vaginal bleeding, unspecified: Secondary | ICD-10-CM | POA: Diagnosis present

## 2023-03-06 LAB — CBC WITH DIFFERENTIAL/PLATELET
Abs Immature Granulocytes: 0.01 10*3/uL (ref 0.00–0.07)
Basophils Absolute: 0 10*3/uL (ref 0.0–0.1)
Basophils Relative: 1 %
Eosinophils Absolute: 0 10*3/uL (ref 0.0–0.5)
Eosinophils Relative: 1 %
HCT: 38 % (ref 36.0–46.0)
Hemoglobin: 12.1 g/dL (ref 12.0–15.0)
Immature Granulocytes: 0 %
Lymphocytes Relative: 53 %
Lymphs Abs: 2.1 10*3/uL (ref 0.7–4.0)
MCH: 28.3 pg (ref 26.0–34.0)
MCHC: 31.8 g/dL (ref 30.0–36.0)
MCV: 88.8 fL (ref 80.0–100.0)
Monocytes Absolute: 0.3 10*3/uL (ref 0.1–1.0)
Monocytes Relative: 7 %
Neutro Abs: 1.5 10*3/uL — ABNORMAL LOW (ref 1.7–7.7)
Neutrophils Relative %: 38 %
Platelets: 222 10*3/uL (ref 150–400)
RBC: 4.28 MIL/uL (ref 3.87–5.11)
RDW: 12.2 % (ref 11.5–15.5)
WBC: 4 10*3/uL (ref 4.0–10.5)
nRBC: 0 % (ref 0.0–0.2)

## 2023-03-06 LAB — HCG, QUANTITATIVE, PREGNANCY: hCG, Beta Chain, Quant, S: <1 m[IU]/mL (ref <5)

## 2023-03-06 LAB — PREGNANCY, URINE: Preg Test, Ur: NEGATIVE

## 2023-03-06 NOTE — ED Triage Notes (Signed)
Pt presents to UC req preg test. States she 5 wks preg. LMP:01/13/23. States she went to [facility] in [place] & had [place] which showed she was 5 wks. After [place] she started bleeding for 5 days.

## 2023-03-06 NOTE — Discharge Instructions (Addendum)
Your urine and blood pregnancy test were both negative and your CBC was reassuring as it does not show any signs of infection or anemia.  Your ultrasound also did not show the presence of an intrauterine pregnancy.  I believe that following your last ultrasound you suffered a miscarriage.  Keep your previously scheduled appointment with your OB/GYN for further evaluation.  If you develop any increased abdominal pain, fever, or have a return of vaginal bleeding I recommend that she go to the ER for evaluation.

## 2023-03-06 NOTE — ED Provider Notes (Signed)
[facility]    CSN: [id number] Arrival date & time: 03/06/23  0906      History   Chief Complaint Chief Complaint  Patient presents with   Possible Pregnancy    HPI Angel Frederick is a 32 y.o. female.   HPI  32 year old female with no significant past medical history presents for evaluation of possible miscarriage.  She reports that her last menstrual period was on 01/13/2023 and 2 weeks ago she had a confirmatory ultrasound at the [facility] clinic in [place] that showed she was [redacted] weeks pregnant.  2 to 3 days after the ultrasound she began to experience vaginal bleeding and bled for 5 days.  She states he did not feel it can normal miscarriage and she did not pass any fetal tissue.  She reports that she was looking.  Since then she has continued to have some lower abdominal cramping and nausea but she denies any fever, pain, or continued vaginal bleeding.  She has taken subsequent urine pregnancy test at home that were negative.  History reviewed. No pertinent past medical history.  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   Encounter for supervision of normal pregnancy in multigravida 07/16/2017   Late prenatal care 07/16/2017    Past Surgical History:  Procedure Laterality Date   NO PAST SURGERIES      OB History     Gravida  1   Para      Term      Preterm      AB      Living         SAB      IAB      Ectopic      Multiple      Live Births               Home Medications    Prior to Admission medications   Not on File    Family History Family History  Problem Relation Age of Onset   Healthy Mother    Prostate cancer Father     Social History Social History   Tobacco Use   Smoking status: Never   Smokeless tobacco: Never  Vaping Use   Vaping status: Never Used  Substance Use Topics   Alcohol use: Never   Drug use: Never     Allergies    Patient has no known allergies.   Review of Systems Review of Systems  Constitutional:  Negative for fever.  Gastrointestinal:  Positive for abdominal pain and nausea.  Genitourinary:  Positive for vaginal bleeding.  Musculoskeletal:  Positive for back pain.     Physical Exam Triage Vital Signs ED Triage Vitals [03/06/23 0944]  Encounter Vitals Group     BP      Systolic BP Percentile      Diastolic BP Percentile      Pulse      Resp 16     Temp      Temp Source Oral     SpO2      Weight      Height      Head Circumference      Peak Flow      Pain Score      Pain Loc      Pain Education      Exclude from Growth Chart    No data found.  Updated Vital Signs BP 108/70 (  BP Location: Left Arm)   Pulse (!) 58   Temp 98.6 F (37 C) (Oral)   Resp 16   Ht 5\' 5"  (1.651 m)   Wt 130 lb (59 kg)   LMP 01/13/2023 (Exact Date)   SpO2 99%   BMI 21.63 kg/m   Visual Acuity Right Eye Distance:   Left Eye Distance:   Bilateral Distance:    Right Eye Near:   Left Eye Near:    Bilateral Near:     Physical Exam Vitals and nursing note reviewed.  Constitutional:      Appearance: Normal appearance. She is not ill-appearing.  HENT:     Head: Normocephalic and atraumatic.  Cardiovascular:     Rate and Rhythm: Normal rate and regular rhythm.     Pulses: Normal pulses.     Heart sounds: Normal heart sounds. No murmur heard.    No friction rub. No gallop.  Pulmonary:     Effort: Pulmonary effort is normal.     Breath sounds: Normal breath sounds. No wheezing, rhonchi or rales.  Abdominal:     General: Abdomen is flat.     Palpations: Abdomen is soft.     Tenderness: There is abdominal tenderness. There is no guarding or rebound.  Skin:    General: Skin is warm and dry.     Capillary Refill: Capillary refill takes less than 2 seconds.  Neurological:     General: No focal deficit present.     Mental Status: She is alert and oriented to person, place, and time.       UC Treatments / Results  Labs (all labs ordered are listed, but only abnormal results are displayed) Labs Reviewed  CBC WITH DIFFERENTIAL/PLATELET - Abnormal; Notable for the following components:      Result Value   Neutro Abs 1.5 (*)    All other components within normal limits  PREGNANCY, URINE  HCG, QUANTITATIVE, PREGNANCY    EKG   Radiology No results found.  Procedures Procedures (including critical care time)  Medications Ordered in UC Medications - No data to display  Initial Impression / Assessment and Plan / UC Course  I have reviewed the triage vital signs and the nursing notes.  Pertinent labs & imaging results that were available during my care of the patient were reviewed by me and considered in my medical decision making (see chart for details).   Patient is a nontoxic-appearing 32 year old female presenting for evaluation of possible miscarriage as outlined HPI above.  She had a confirmatory ultrasound 2 weeks ago showing that she was [redacted] weeks pregnant but then had subsequent episode of bleeding but no passage of tissue.  She has been parenting lower abdominal cramping and nausea but no bleeding and she denies fever.  On exam she her abdomen is flat, soft, with lower abdominal tenderness without focal finding.  No guarding or rebound.  Given that she had a confirmed ultrasound showing a 5-week gestation fetus followed by bleeding but no passage of tissue there is concern for retained products of conception and possible spontaneous abortion.  Will check urine pregnancy, beta quant, and CBC.  I will also order a pelvic ultrasound to evaluate for the presence of retained fetal products.  Urine pregnancy test is negative.  hCG is less than 1.  CBC shows a normal white count of 4, H&H is normal at 12.1 and 38, and platelets are normal at 222.  Patient is an ultrasound currently to evaluate for possible retained  products of conception.  Official ultrasound  report is pending.  Unofficial findings did not reveal the presence of IUP.  If the radiology impression of ultrasound shows any concerns for retained products of conception I will contact the patient via phone and directed her to go to the emergency department.  I will discharge patient home with a diagnosis of spontaneous abortion and have her keep her follow-up appointment with OB as previously scheduled.  If she develops any increase in abdominal pain, return of uterine bleeding, or fever she should go to the ER for evaluation.   Final Clinical Impressions(s) / UC Diagnoses   Final diagnoses:  Miscarriage     Discharge Instructions      Your urine and blood pregnancy test were both negative and your CBC was reassuring as it does not show any signs of infection or anemia.  Your ultrasound also did not show the presence of an intrauterine pregnancy.  I believe that following your last ultrasound you suffered a miscarriage.  Keep your previously scheduled appointment with your OB/GYN for further evaluation.  If you develop any increased abdominal pain, fever, or have a return of vaginal bleeding I recommend that she go to the ER for evaluation.     ED Prescriptions   None    PDMP not reviewed this encounter.   Becky Augusta, NP 03/06/23 1125

## 2023-04-12 ENCOUNTER — Encounter: Payer: Medicaid Other | Admitting: Obstetrics and Gynecology

## 2023-07-05 ENCOUNTER — Ambulatory Visit
Admission: EM | Admit: 2023-07-05 | Discharge: 2023-07-05 | Disposition: A | Discharge status: Home / Self Care | Payer: Medicaid Other | Attending: Emergency Medicine | Admitting: Emergency Medicine

## 2023-07-05 DIAGNOSIS — H6001 Abscess of right external ear: Secondary | ICD-10-CM | POA: Diagnosis present

## 2023-07-05 MED ORDER — IBUPROFEN 600 MG PO TABS: 600.0000 mg | ORAL_TABLET | Freq: Three times a day (TID) | ORAL | 0 refills | Status: AC | PRN

## 2023-07-05 MED ORDER — CHLORHEXIDINE GLUCONATE 4 % EX SOLN: Freq: Every day | CUTANEOUS | 0 refills | Status: DC | PRN

## 2023-07-05 MED ORDER — CEPHALEXIN 500 MG PO CAPS: 1000.0000 mg | ORAL_CAPSULE | Freq: Two times a day (BID) | ORAL | 0 refills | Status: AC

## 2023-07-05 MED ORDER — DOXYCYCLINE HYCLATE 100 MG PO CAPS: 100.0000 mg | ORAL_CAPSULE | Freq: Two times a day (BID) | ORAL | 0 refills | Status: AC

## 2023-07-05 NOTE — Discharge Instructions (Addendum)
 Keep this clean with chlorhexidine soap.  Warm compresses.  Take the ibuprofen with 1000 mg of Tylenol 3 to 4 times a day as needed for pain.  Finish the Keflex and doxycycline, even if you feel better.  Return here if not better after finishing the antibiotics, sooner if you get worse.

## 2023-07-05 NOTE — ED Triage Notes (Signed)
 Right ear swelling on the outside.

## 2023-07-05 NOTE — ED Provider Notes (Signed)
 HPI  SUBJECTIVE:  Angel Frederick is a 33 y.o. female who presents with throbbing, constant pain and pressure, swelling anterior to her right ear at her ear pit starting 2 days ago.  She states that she has been wearing headphones recently, and has had two infections since getting the headphones, but has never required medical attention.  She was able to express a small amount of fluid last night without improvement in her symptoms.  No fevers, erythema, other trauma to the area.  She tried a [employer] nose strip in an attempt to pull material out of the ear pit.  No alleviating factors.  Symptoms are worse when she wears headphones.  Past medical history negative for diabetes.  LMP: Last week.  Denies possibility of being pregnant.  PCP: None.    History reviewed. No pertinent past medical history.  Past Surgical History:  Procedure Laterality Date   NO PAST SURGERIES      Family History  Problem Relation Age of Onset   Healthy Mother    Prostate cancer Father     Social History   Tobacco Use   Smoking status: Never   Smokeless tobacco: Never  Vaping Use   Vaping status: Never Used  Substance Use Topics   Alcohol use: Never   Drug use: Never    No current facility-administered medications for this encounter.  Current Outpatient Medications:    cephALEXin (KEFLEX) 500 MG capsule, Take 2 capsules (1,000 mg total) by mouth 2 (two) times daily for 5 days., Disp: 20 capsule, Rfl: 0   chlorhexidine (HIBICLENS) 4 % external liquid, Apply topically daily as needed., Disp: 120 mL, Rfl: 0   doxycycline (VIBRAMYCIN) 100 MG capsule, Take 1 capsule (100 mg total) by mouth 2 (two) times daily for 5 days., Disp: 10 capsule, Rfl: 0   ibuprofen (ADVIL) 600 MG tablet, Take 1 tablet (600 mg total) by mouth every 8 (eight) hours as needed., Disp: 30 tablet, Rfl: 0  No Known Allergies   ROS  As noted in HPI.   Physical Exam  BP 118/78 (BP Location: Left Arm)   Pulse 80   Temp 98 F (36.7  C) (Oral)   Resp 16   LMP 06/27/2023 (Exact Date)   SpO2 99%   Constitutional: Well developed, well nourished, no acute distress Eyes:  EOMI, conjunctiva normal bilaterally HENT: Tender swelling superior to the tragus right ear pit with expressible bloody purulent drainage.     Respiratory: Normal inspiratory effort Cardiovascular: Normal rate GI: nondistended skin: No rash, skin intact Musculoskeletal: no deformities Neurologic: Alert & oriented x 3, no focal neuro deficits Psychiatric: Speech and behavior appropriate   ED Course   Medications - No data to display  Orders Placed This Encounter  Procedures   Aerobic Culture w Gram Stain (superficial specimen)    Standing Status:   Standing    Number of Occurrences:   1   Nursing Communication Please set up with a PCP prior to discharge    Please set up with a PCP prior to discharge    Standing Status:   Standing    Number of Occurrences:   1    No results found for this or any previous visit (from the past 24 hours). No results found.  ED Clinical Impression  1. Abscess of right external ear      ED Assessment/Plan     I was able to express some bloody purulent drainage, sending this off for culture to confirm  antibiotic choice.  Sending home with Keflex and doxycycline for 5 days, ibuprofen/Tylenol, chlorhexidine soap.  Warm compresses.  Return here if not getting better after finishing antibiotics, or if she gets worse.  Will have staff set patient up with a PCP prior to discharge.  Discussed MDM, treatment plan, and plan for follow-up with patient.  patient agrees with plan.   Meds ordered this encounter  Medications   doxycycline (VIBRAMYCIN) 100 MG capsule    Sig: Take 1 capsule (100 mg total) by mouth 2 (two) times daily for 5 days.    Dispense:  10 capsule    Refill:  0   cephALEXin (KEFLEX) 500 MG capsule    Sig: Take 2 capsules (1,000 mg total) by mouth 2 (two) times daily for 5 days.     Dispense:  20 capsule    Refill:  0   ibuprofen (ADVIL) 600 MG tablet    Sig: Take 1 tablet (600 mg total) by mouth every 8 (eight) hours as needed.    Dispense:  30 tablet    Refill:  0   chlorhexidine (HIBICLENS) 4 % external liquid    Sig: Apply topically daily as needed.    Dispense:  120 mL    Refill:  0      *This clinic note was created using Scientist, clinical (histocompatibility and immunogenetics). Therefore, there may be occasional mistakes despite careful proofreading.  ?    Domenick Gong, MD 07/05/23 1740

## 2023-07-09 LAB — AEROBIC CULTURE W GRAM STAIN (SUPERFICIAL SPECIMEN)

## 2023-07-10 ENCOUNTER — Telehealth (HOSPITAL_BASED_OUTPATIENT_CLINIC_OR_DEPARTMENT_OTHER): Payer: Self-pay

## 2023-07-10 MED ORDER — AMOXICILLIN-POT CLAVULANATE 875-125 MG PO TABS: 1.0000 | ORAL_TABLET | Freq: Two times a day (BID) | ORAL | 0 refills | Status: AC

## 2023-07-10 NOTE — Telephone Encounter (Signed)
 Reviewed with patient, verified pharmacy, prescription sent

## 2023-07-10 NOTE — Telephone Encounter (Signed)
-----   Message from [place] sent at 07/10/2023  8:09 AM EST ----- Yes, I would like to change her to Augmentin 875 mg p.o. twice daily for 2 weeks.  If she is not getting better in about 10 days, that she needs to let us know so I can make a referral to ID.  Thank you ----- Message ----- From: Warren Danes, RN Sent: 07/09/2023   2:05 PM EST To: Domenick Gong, MD  She said that it was still draining some with a foul smell, and that it was still swollen but improving. Overall she said it was doing better. I told her I would just reach out to you to confirm that you didn't want to change to the Augmentin, but to carry on with current plan for now. I told her I'd let her know tomorrow if you did want to make the change

## 2023-07-23 ENCOUNTER — Ambulatory Visit: Payer: Medicaid Other | Admitting: Student

## 2023-11-30 ENCOUNTER — Encounter: Payer: Self-pay | Admitting: Emergency Medicine

## 2023-11-30 ENCOUNTER — Ambulatory Visit (INDEPENDENT_AMBULATORY_CARE_PROVIDER_SITE_OTHER)

## 2023-11-30 ENCOUNTER — Ambulatory Visit
Admission: EM | Admit: 2023-11-30 | Discharge: 2023-11-30 | Disposition: A | Discharge status: Home / Self Care | Attending: Family Medicine | Admitting: Family Medicine

## 2023-11-30 ENCOUNTER — Other Ambulatory Visit: Payer: Self-pay

## 2023-11-30 DIAGNOSIS — J069 Acute upper respiratory infection, unspecified: Secondary | ICD-10-CM | POA: Insufficient documentation

## 2023-11-30 DIAGNOSIS — Z20818 Contact with and (suspected) exposure to other bacterial communicable diseases: Secondary | ICD-10-CM | POA: Diagnosis present

## 2023-11-30 DIAGNOSIS — R071 Chest pain on breathing: Secondary | ICD-10-CM | POA: Insufficient documentation

## 2023-11-30 LAB — PREGNANCY, URINE: Preg Test, Ur: NEGATIVE

## 2023-11-30 LAB — GROUP A STREP BY PCR: Group A Strep by PCR: NOT DETECTED

## 2023-11-30 MED ORDER — NAPROXEN 500 MG PO TABS: 500.0000 mg | ORAL_TABLET | Freq: Two times a day (BID) | ORAL | 0 refills | Status: AC

## 2023-11-30 MED ORDER — CYCLOBENZAPRINE HCL 5 MG PO TABS: 5.0000 mg | ORAL_TABLET | Freq: Every evening | ORAL | 0 refills | Status: DC | PRN

## 2023-11-30 MED ORDER — AMOXICILLIN 500 MG PO CAPS: 1000.0000 mg | ORAL_CAPSULE | Freq: Every day | ORAL | 0 refills | Status: AC

## 2023-11-30 NOTE — ED Provider Notes (Signed)
 [facility]    CSN: [id number] Arrival date & time: 11/30/23  0847      History   Chief Complaint Chief Complaint  Patient presents with   Sore Throat    HPI Angel Frederick is a 33 y.o. female.   HPI  History obtained from the patient. Angel Frederick presents for new respiratory symptoms, GU symptoms and chest discomfort with deep breathing for the past month.   The respiratory symptoms started [place] her [place] Angel Frederick[place] Some of the kids there were coughing and sneezing. Has cough, sore throat, headache, body aches fort the past 3 days.  She tested for COVID, strep and influenza yesterday and was positive for strep. Mom took 4 Midol  for the migraine headache. No fever, vomiting, diarrhea, or decreased appetite.  Has been having trouble taking a deep breathe since June.  Has not been working out. No breast lumps. Has pain with deep breathing.   She denies vaginal itching and dysuria. She had intercourse with her childrens father and wants to be sure there is no STD. Patient's last menstrual period was 10/25/2023. She is typically not late but notes increased stress and panic attacks for the past 2 weeks.         History reviewed. No pertinent past medical history.  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 Authorizing Provider  amoxicillin  (AMOXIL ) 500 MG capsule Take 2 capsules (1,000 mg  total) by mouth daily for 10 days. 11/30/23 12/10/23 Yes Lane Eland, DO  cyclobenzaprine  (FLEXERIL ) 5 MG tablet Take 1 tablet (5 mg total) by mouth at bedtime as needed. 11/30/23  Yes Keaun Schnabel, DO  naproxen  (NAPROSYN ) 500 MG tablet Take 1 tablet (500 mg total) by mouth 2 (two) times daily with a meal. 11/30/23  Yes Suraj Ramdass, DO  chlorhexidine  (HIBICLENS ) 4 % external liquid Apply topically daily as needed. 07/05/23   Mortenson, Ashley, MD  ibuprofen  (ADVIL ) 600 MG tablet Take 1 tablet (600 mg total) by mouth every 8 (eight) hours as needed. 07/05/23   Van Knee, MD    Family History Family History  Problem Relation Age of Onset   Healthy Mother    Prostate cancer Father     Social History Social History   Tobacco Use   Smoking status: Never   Smokeless tobacco: Never  Vaping Use   Vaping status: Never Used  Substance Use Topics   Alcohol use: Never   Drug use: Never     Allergies   Patient has no known allergies.   Review of Systems Review of Systems: negative unless  otherwise stated in HPI.      Physical Exam Triage Vital Signs ED Triage Vitals  Encounter Vitals Group     BP 11/30/23 0918 118/76     Girls Systolic BP Percentile --      Girls Diastolic BP Percentile --      Boys Systolic BP Percentile --      Boys Diastolic BP Percentile --      Pulse Rate 11/30/23 0918 81     Resp 11/30/23 0918 17     Temp 11/30/23 0918 (!) 97.5 F (36.4 C)     Temp Source 11/30/23 0918 Temporal     SpO2 11/30/23 0918 99 %     Weight --      Height --      Head Circumference --      Peak Flow --      Pain Score 11/30/23 0916 6     Pain Loc --      Pain Education --      Exclude from Growth Chart --    No data found.  Updated Vital Signs BP 118/76 (BP Location: Left Arm)   Pulse 81   Temp (!) 97.5 F (36.4 C) (Temporal)   Resp 17   LMP 10/25/2023   SpO2 99%   Visual Acuity Right Eye Distance:   Left Eye Distance:   Bilateral Distance:     Right Eye Near:   Left Eye Near:    Bilateral Near:     Physical Exam GEN:     alert, non-toxic appearing female in no distress    HENT:  mucus membranes moist, oropharyngeal without lesions, moderate erythema, 1+ tonsillar hypertrophy without visible exudates, no nasal discharge, bilateral TM normal EYES:   no scleral injection or discharge NECK:  normal ROM, no meningismus   RESP:  no increased work of breathing, coarse breathe sounds bilaterally CVS:   regular rate and rhythm Skin:   warm and dry    UC Treatments / Results  Labs (all labs ordered are listed, but only abnormal results are displayed) Labs Reviewed  GROUP A STREP BY PCR  PREGNANCY, URINE  CERVICOVAGINAL ANCILLARY ONLY    EKG   Radiology DG Chest 2 View Result Date: 11/30/2023 CLINICAL DATA:  cough, chest pain with breathing EXAM: CHEST - 2 VIEW COMPARISON:  None Available. FINDINGS: Lungs are clear. Heart size and mediastinal contours are within normal limits. No effusion. Visualized bones unremarkable. IMPRESSION: No acute cardiopulmonary disease. Electronically Signed   By: JONETTA Faes M.D.   On: 11/30/2023 10:12    Procedures Procedures (including critical care time)  Medications Ordered in UC Medications - No data to display  Initial Impression / Assessment and Plan / UC Course  I have reviewed the triage vital signs and the nursing notes.  Pertinent labs & imaging results that were available during my care of the patient were reviewed by me and considered in my medical decision making (see chart for details).       Pt is a 33 y.o. female who presents for 3 days of respiratory symptoms. Angel Frederick is afebrile here with recent antipyretics. Satting well on room air. Overall pt is non-toxic appearing, well hydrated, without respiratory distress. Pulmonary exam is remarkable for coarse breathe sounds bilaterally.  COVID and influenza panel obtained at her job and was negative. Chest xray personally  reviewed by me without focal pneumonia, pleural effusion, cardiomegaly or pneumothorax. Radiologist impression reviewed.  Discussed symptomatic treatment. Typical duration of  symptoms discussed.   Reports positive strep test however surprisingly her test here is negative. She has strep exposure with exam supporting possibility of strep therefore will treat with antibiotics as below.    She has been having left sided chest pain for the past month that gets worse with breathing.  CXR is unremarkable as above. Pain is reproducible therefore will treat for MSK related pain with muscle relaxer and Naprosyn  as below.    Her last period was greater than a month ago. Urine pregnancy test is negative. Denies symptoms and doesn't request prophylactic treatment.  Obtained gonorrhea, chlamydia, BV, yeast and trichomonas vaginal swab.      Return and ED precautions given and voiced understanding. Discussed MDM, treatment plan and plan for follow-up with patient who agrees with plan.     Final Clinical Impressions(s) / UC Diagnoses   Final diagnoses:  Chest pain on breathing  Strep throat exposure  URI with cough and congestion     Discharge Instructions      You are being treated for strep throat.  Your chest xray did not show evidence of pneumonia or cause of your chest discomfort.   Use the Naprosyn  and muscle relaxer for chest wall pain.        ED Prescriptions     Medication Sig Dispense Auth. Provider   amoxicillin  (AMOXIL ) 500 MG capsule Take 2 capsules (1,000 mg total) by mouth daily for 10 days. 20 capsule Fantashia Shupert, DO   cyclobenzaprine  (FLEXERIL ) 5 MG tablet Take 1 tablet (5 mg total) by mouth at bedtime as needed. 30 tablet Rielly Brunn, DO   naproxen  (NAPROSYN ) 500 MG tablet Take 1 tablet (500 mg total) by mouth 2 (two) times daily with a meal. 30 tablet Mayetta Castleman, DO      PDMP not reviewed this encounter.   Wali Reinheimer, DO 12/02/23 [phone]

## 2023-11-30 NOTE — ED Triage Notes (Signed)
 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 Can't be treated there. Needs to be retested and treated. Also wants STD testing. Just the swab

## 2023-11-30 NOTE — Discharge Instructions (Addendum)
 You are being treated for strep throat.  Your chest xray did not show evidence of pneumonia or cause of your chest discomfort.   Use the Naprosyn and muscle relaxer for chest wall pain.

## 2023-12-03 ENCOUNTER — Ambulatory Visit (HOSPITAL_COMMUNITY): Payer: Self-pay

## 2023-12-03 LAB — CERVICOVAGINAL ANCILLARY ONLY
Bacterial Vaginitis (gardnerella): POSITIVE — AB
Candida Glabrata: NEGATIVE
Candida Vaginitis: NEGATIVE
Chlamydia: NEGATIVE
Comment: NEGATIVE
Comment: NEGATIVE
Comment: NEGATIVE
Comment: NEGATIVE
Comment: NEGATIVE
Comment: NORMAL
Neisseria Gonorrhea: NEGATIVE
Trichomonas: NEGATIVE

## 2024-01-30 ENCOUNTER — Ambulatory Visit (INDEPENDENT_AMBULATORY_CARE_PROVIDER_SITE_OTHER): Admitting: Family

## 2024-01-30 ENCOUNTER — Ambulatory Visit: Payer: Self-pay | Admitting: Family

## 2024-01-30 ENCOUNTER — Other Ambulatory Visit (HOSPITAL_COMMUNITY)
Admission: RE | Admit: 2024-01-30 | Discharge: 2024-01-30 | Disposition: A | Discharge status: Home / Self Care | Source: Ambulatory Visit | Attending: Family | Admitting: Family

## 2024-01-30 ENCOUNTER — Other Ambulatory Visit: Payer: Self-pay

## 2024-01-30 VITALS — BP 115/70 | HR 75 | Temp 98.0°F | Resp 16 | Ht 65.5 in | Wt 131.4 lb

## 2024-01-30 DIAGNOSIS — Z8742 Personal history of other diseases of the female genital tract: Secondary | ICD-10-CM

## 2024-01-30 DIAGNOSIS — N926 Irregular menstruation, unspecified: Secondary | ICD-10-CM | POA: Insufficient documentation

## 2024-01-30 DIAGNOSIS — K219 Gastro-esophageal reflux disease without esophagitis: Secondary | ICD-10-CM

## 2024-01-30 DIAGNOSIS — F32A Depression, unspecified: Secondary | ICD-10-CM

## 2024-01-30 DIAGNOSIS — B9689 Other specified bacterial agents as the cause of diseases classified elsewhere: Secondary | ICD-10-CM

## 2024-01-30 DIAGNOSIS — R9431 Abnormal electrocardiogram [ECG] [EKG]: Secondary | ICD-10-CM | POA: Diagnosis not present

## 2024-01-30 DIAGNOSIS — Z7689 Persons encountering health services in other specified circumstances: Secondary | ICD-10-CM

## 2024-01-30 DIAGNOSIS — R079 Chest pain, unspecified: Secondary | ICD-10-CM | POA: Diagnosis not present

## 2024-01-30 DIAGNOSIS — F419 Anxiety disorder, unspecified: Secondary | ICD-10-CM

## 2024-01-30 LAB — POCT URINE PREGNANCY: Preg Test, Ur: NEGATIVE

## 2024-01-30 MED ORDER — FLUOXETINE HCL 10 MG PO CAPS: 10.0000 mg | ORAL_CAPSULE | Freq: Every day | ORAL | 0 refills | Status: AC

## 2024-01-30 MED ORDER — OMEPRAZOLE 20 MG PO CPDR: 20.0000 mg | DELAYED_RELEASE_CAPSULE | Freq: Every day | ORAL | 0 refills | Status: AC

## 2024-01-30 MED ORDER — CYCLOBENZAPRINE HCL 5 MG PO TABS: 5.0000 mg | ORAL_TABLET | Freq: Every evening | ORAL | 0 refills | Status: AC | PRN

## 2024-01-30 NOTE — Progress Notes (Signed)
 Subjective:    Angel Frederick - 33 y.o. female MRN [id number]  Date of birth: July 22, 1990  HPI  Angel Frederick is to establish care.   Current issues and/or concerns: - Chest pain persisting since recent Urgent Care visit. States feels like she pulled a muscle. Denies recent trauma/injury and red flag symptoms. States Cyclobenzaprine  helped some. States she had genetic testing to see if related to chest pain .  - Acid reflux. Denies red flag symptoms.  - Irregular periods for 2 months. Reports history of endometriosis. States considered if related to menopause.  - Anxiety depression. States she is established with Psychiatry (seen once). States Psychiatry told her she must be seen by them at least 3 times before they will prescribe her medication. She denies thoughts of self-harm, suicidal ideations, homicidal ideations.  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 She has never used smokeless tobacco. She reports that she does not drink alcohol and does not use drugs.   Family History  family history includes Healthy in her mother; Prostate cancer in her father.   Medications: reviewed and updated   Objective:   Physical Exam BP 115/70   Pulse 75   Temp 98 F (36.7 C) (Oral)   Resp 16   Ht 5' 5.5 (1.664 m)   Wt 131 lb 6.4 oz (59.6 kg)   LMP 01/30/2024 (Exact Date)   SpO2 99%   Breastfeeding No   BMI 21.53 kg/m   Physical Exam HENT:     Head: Normocephalic and atraumatic.     Nose: Nose normal.      Mouth/Throat:     Mouth: Mucous membranes are moist.     Pharynx: Oropharynx is clear.  Eyes:     Extraocular Movements: Extraocular movements intact.     Conjunctiva/sclera: Conjunctivae normal.     Pupils: Pupils are equal, round, and reactive to light.  Cardiovascular:     Rate and Rhythm: Normal rate and regular rhythm.     Pulses: Normal pulses.     Heart sounds: Normal heart sounds.  Pulmonary:     Effort: Pulmonary effort is normal.     Breath sounds: Normal breath sounds.  Musculoskeletal:        General: Normal range of motion.     Cervical back: Normal range of motion and neck supple.  Neurological:     General: No focal deficit present.     Mental Status: She is alert and oriented to person, place, and time.  Psychiatric:        Mood and Affect: Mood normal.        Behavior: Behavior normal.        Assessment & Plan:  1. Encounter to establish care (Primary) - Patient presents today to establish care. During the interim follow-up with primary provider as scheduled.  - Return for annual physical examination, labs, and health maintenance. Arrive fasting meaning having no food for at least 8 hours  prior to appointment. You may have only water or black coffee. Please take scheduled medications as normal.  2. Chest pain, unspecified type 3. Abnormal EKG - Patient today in office with no cardiopulmonary/acute distress.  - Continue Cyclobenzaprine  as prescribed. Counseled on medication adherence/adverse effects.  - EKG for evaluation.  - Routine screening.  - Referral to Cardiology for evaluation/management.  - Follow-up with primary provider as scheduled. - EKG 12-Lead - Basic Metabolic Panel - CBC - Ambulatory referral to Cardiology - cyclobenzaprine  (FLEXERIL ) 5 MG tablet; Take 1 tablet (5 mg total) by mouth at bedtime as needed.  Dispense: 90 tablet; Refill: 0  4. Gastroesophageal reflux disease, unspecified whether esophagitis present - Omeprazole  as  prescribed. Counseled on medication adherence/adverse effects.  - Follow-up with primary provider as scheduled.  - omeprazole  (PRILOSEC) 20 MG capsule; Take 1 capsule (20 mg total) by mouth daily.  Dispense: 90 capsule; Refill: 0  5. Irregular menses 6. History of endometriosis - Routine screening.  - Referral to Gynecology for evaluation/management. - Ambulatory referral to Gynecology - POCT urine pregnancy; Future - hCG, serum, qualitative - Follicle stimulating hormone - Luteinizing hormone - Cervicovaginal ancillary only  7. Anxiety and depression - Patient denies thoughts of self-harm, suicidal ideations, homicidal ideations. - Fluoxetine  as prescribed. Counseled on medication adherence/adverse effects. - Keep all scheduled appointments with established Psychiatry.  - Follow-up with primary provider as scheduled.  - FLUoxetine  (PROZAC ) 10 MG capsule; Take 1 capsule (10 mg total) by mouth daily.  Dispense: 90 capsule; Refill: 0    Patient was given clear instructions to go to Emergency Department or return to medical center if symptoms don't improve, worsen, or new problems develop.The patient verbalized understanding.  I discussed the assessment and treatment plan with the patient. The patient was provided an opportunity to ask questions and all were answered. The patient agreed with the plan and demonstrated an understanding of the instructions.   The patient was advised to call back or seek an in-person evaluation if the symptoms worsen or if the condition fails to improve as anticipated.    Greig Drones, NP 01/30/2024, 4:36 PM Primary Care at [facility]

## 2024-01-30 NOTE — Progress Notes (Signed)
 Late menstrual cycles, persistent pulling her chest had chest xray done at urgent care and nothing was wrong, patient is under stress, patient scored a 20 on the PHQ-9, patient scored a 18 on the GAD-7

## 2024-01-31 ENCOUNTER — Ambulatory Visit: Payer: Self-pay

## 2024-01-31 ENCOUNTER — Telehealth: Payer: Self-pay | Admitting: Family

## 2024-01-31 LAB — BASIC METABOLIC PANEL WITH GFR
BUN/Creatinine Ratio: 11 (ref 9–23)
BUN: 10 mg/dL (ref 6–20)
CO2: 20 mmol/L (ref 20–29)
Calcium: 9.2 mg/dL (ref 8.7–10.2)
Chloride: 103 mmol/L (ref 96–106)
Creatinine, Ser: 0.95 mg/dL (ref 0.57–1.00)
Glucose: 91 mg/dL (ref 70–99)
Potassium: 4.2 mmol/L (ref 3.5–5.2)
Sodium: 140 mmol/L (ref 134–144)
eGFR: 81 mL/min/1.73 (ref >59)

## 2024-01-31 LAB — CERVICOVAGINAL ANCILLARY ONLY
Bacterial Vaginitis (gardnerella): POSITIVE — AB
Candida Glabrata: NEGATIVE
Candida Vaginitis: NEGATIVE
Chlamydia: NEGATIVE
Comment: NEGATIVE
Comment: NEGATIVE
Comment: NEGATIVE
Comment: NEGATIVE
Comment: NEGATIVE
Comment: NORMAL
Neisseria Gonorrhea: NEGATIVE
Trichomonas: NEGATIVE

## 2024-01-31 LAB — CBC
Hematocrit: 36.8 % (ref 34.0–46.6)
Hemoglobin: 11.7 g/dL (ref 11.1–15.9)
MCH: 28.7 pg (ref 26.6–33.0)
MCHC: 31.8 g/dL (ref 31.5–35.7)
MCV: 90 fL (ref 79–97)
Platelets: 270 x10E3/uL (ref 150–450)
RBC: 4.07 x10E6/uL (ref 3.77–5.28)
RDW: 12.3 % (ref 11.7–15.4)
WBC: 6 x10E3/uL (ref 3.4–10.8)

## 2024-01-31 LAB — HCG, SERUM, QUALITATIVE: hCG,Beta Subunit,Qual,Serum: NEGATIVE m[IU]/mL (ref <6)

## 2024-01-31 LAB — LUTEINIZING HORMONE: LH: 3.2 m[IU]/mL

## 2024-01-31 LAB — FOLLICLE STIMULATING HORMONE: FSH: 5.9 m[IU]/mL

## 2024-01-31 NOTE — Telephone Encounter (Signed)
 FYI Only or Action Required?: FYI only for provider.  Patient was last seen in primary care on 01/30/2024 by Lorren Greig PARAS, NP.  Called Nurse Triage reporting Chest Pain.  Symptoms began several months ago.  Interventions attempted: Prescription medications: Naproxen .  Symptoms are: gradually worsening.  Triage Disposition: Go to ED Now (or PCP Triage)  Patient/caregiver understands and will follow disposition?: Yes    Copied from CRM #[id number]. Topic: Clinical - Red Word Triage >> Jan 31, 2024  9:27 AM Rosaria BRAVO wrote: Red Word that prompted transfer to Nurse Triage: Pt has chest pain  Caller not with patient Angeline from [employer] called Best contact: [phone] Reason for Disposition  Taking a deep breath makes pain worse  Answer Assessment - Initial Assessment Questions Angeline from [employer] called on behalf of the patient and disconnected the call on transfer from PAS. This RN spoke with the patient regarding symptoms. Patient states she went to  [facility] in July for symptoms and they have not improved. Seen yesterday in office for symptoms.  She states inhaling and exhaling makes the pain feel tighter in her chest. Currently taking naproxen  for pain and states it feels like a pulled muscle Denies SOB.   1. LOCATION: Where does it hurt?       L side underneath breast bone in a muscle area  2. RADIATION: Does the pain go anywhere else? (e.g., into neck, jaw, arms, back)     Denies  3. ONSET: When did the chest pain begin? (Minutes, hours or days)      A few months ago  4. PATTERN: Does the pain come and go, or has it been constant since it started?  Does it get worse with exertion?      Constant  5. DURATION: How long does it last (e.g., seconds, minutes, hours)     Feels pain all the time, does not go away  6. SEVERITY: How bad is the pain?  (e.g., Scale 1-10; mild, moderate, or severe)     7/10 7. CARDIAC RISK FACTORS: Do you have any  history of heart problems or risk factors for heart disease? (e.g., angina, prior heart attack; diabetes, high blood pressure, high cholesterol, smoker, or strong family history of heart disease)     Denies  8. PULMONARY RISK FACTORS: Do you have any history of lung disease?  (e.g., blood clots in lung, asthma, emphysema, birth control pills)     Denies  9. CAUSE: What do you think is causing the chest pain?     Stress related  10. OTHER SYMPTOMS: Do you have any other symptoms? (e.g., dizziness, nausea, vomiting, sweating, fever, difficulty breathing, cough)       Panic attacks  Protocols used: Chest Pain-A-AH

## 2024-01-31 NOTE — Telephone Encounter (Signed)
 A document form from [employer] has been faxed: Short Term Disability Claim, to be filled out by provider. Send document back via Fax within 7-days. Document is located in providers tray at front office.          Fax number: [phone]

## 2024-01-31 NOTE — Telephone Encounter (Signed)
 Patient wants to hold off on the paperwork because she has 2 other MD appointments to go to and does not know if the the problems is stress related or heart.  I made her aware to schedule an appointment if she needs the paperwork do here.

## 2024-02-01 ENCOUNTER — Telehealth: Payer: Self-pay | Admitting: Family

## 2024-02-01 MED ORDER — METRONIDAZOLE 500 MG PO TABS: 500.0000 mg | ORAL_TABLET | Freq: Two times a day (BID) | ORAL | 0 refills | Status: AC

## 2024-02-01 NOTE — Telephone Encounter (Signed)
 Report to Emergency Department/Urgent Care/call 911 for immediate medical evaluation.

## 2024-02-01 NOTE — Telephone Encounter (Signed)
 Patient wants me to hold off on paperwork she is going to schedule an appointment with MD

## 2024-02-01 NOTE — Telephone Encounter (Signed)
 I made patient aware of pcp recommendations and she stated she already scheduled an appointment with cardiology

## 2024-02-01 NOTE — Telephone Encounter (Signed)
 A document form from [employer] has been faxed: FMLA, to be filled out by provider. Send document back via Fax within 7-days. Document is located in providers tray at front office.          Fax number: [phone]

## 2024-02-04 ENCOUNTER — Encounter: Payer: Self-pay | Admitting: Medical

## 2024-02-04 ENCOUNTER — Ambulatory Visit: Attending: Medical | Admitting: Medical

## 2024-02-04 VITALS — BP 102/58 | HR 62 | Ht 65.0 in | Wt 130.2 lb

## 2024-02-04 DIAGNOSIS — R0602 Shortness of breath: Secondary | ICD-10-CM | POA: Insufficient documentation

## 2024-02-04 DIAGNOSIS — R079 Chest pain, unspecified: Secondary | ICD-10-CM | POA: Diagnosis not present

## 2024-02-04 MED ORDER — METOPROLOL TARTRATE 50 MG PO TABS: ORAL_TABLET | ORAL | 0 refills | Status: AC

## 2024-02-04 NOTE — Patient Instructions (Addendum)
 Medication Instructions:  Your physician recommends that you continue on your current medications as directed. Please refer to the Current Medication list given to you today.    *If you need a refill on your cardiac medications before your next appointment, please call your pharmacy*  Lab Work: Your provider would like for you to have following labs drawn today Lipid, TSH, CRP, Sed Rate, A1C, BMP).     Testing/Procedures: Your physician has requested that you have an echocardiogram. Echocardiography is a painless test that uses sound waves to create images of your heart. It provides your doctor with information about the size and shape of your heart and how well your heart's chambers and valves are working.   You may receive an ultrasound enhancing agent through an IV if needed to better visualize your heart during the echo. This procedure takes approximately one hour.  There are no restrictions for this procedure.  This will take place at [address] [facility] [address], [place] [address]  Please note: We ask at that you not bring children with you during ultrasound (echo/ vascular) testing. Due to room size and safety concerns, children are not allowed in the ultrasound rooms during exams. Our front office staff cannot provide observation of children in our lobby area while testing is being conducted. An adult accompanying a patient to their appointment will only be allowed in the ultrasound room at the discretion of the ultrasound technician under special circumstances. We apologize for any inconvenience.     Your cardiac CT will be scheduled at one of the below locations:   [facility] [address] [place], [place] [address] [phone]  If scheduled at [facility] or [facility], please arrive 15 mins early for check-in and test prep.  There is spacious parking and easy access to the  radiology department from the [facility] entrance. Please enter here and check-in with the desk attendant.   Please follow these instructions carefully (unless otherwise directed):  An IV will be required for this test and Nitroglycerin will be given.    On the Night Before the Test: Be sure to Drink plenty of water. Do not consume any caffeinated/decaffeinated beverages or chocolate 12 hours prior to your test. Do not take any antihistamines 12 hours prior to your test.  On the Day of the Test: Drink plenty of water until 1 hour prior to the test. Do not eat any food 1 hour prior to test. You may take your regular medications prior to the test.  Take metoprolol (Lopressor) 50 mg  two hours prior to test. Patients who wear a continuous glucose monitor MUST remove the device prior to scanning. FEMALES- please wear underwire-free bra if available, avoid dresses & tight clothing       After the Test: Drink plenty of water. After receiving IV contrast, you may experience a mild flushed feeling. This is normal. On occasion, you may experience a mild rash up to 24 hours after the test. This is not dangerous. If this occurs, you can take Benadryl 25 mg, Zyrtec, Claritin, or Allegra and increase your fluid intake. (Patients taking Tikosyn should avoid Benadryl, and may take Zyrtec, Claritin, or Allegra) If you experience trouble breathing, this can be serious. If it is severe call 911 IMMEDIATELY. If it is mild, please call our office.  We will call to schedule your test 2-4 weeks out understanding that some insurance companies will need  an authorization prior to the service being performed.   For more information and frequently asked questions, please visit our website : [email or web address]  For non-scheduling related questions, please contact the cardiac imaging nurse navigator should you have any questions/concerns: Cardiac Imaging Nurse Navigators Direct Office  Dial: [phone]   For scheduling needs, including cancellations and rescheduling, please call [place], [phone].    Follow-Up: At [facility], you and your health needs are our priority.  As part of our continuing mission to provide you with exceptional heart care, our providers are all part of one team.  This team includes your primary Cardiologist (physician) and Advanced Practice Providers or APPs (Physician Assistants and Nurse Practitioners) who all work together to provide you with the care you need, when you need it.  Your next appointment:   4-6 week(s)  Provider:   Mikey Fishman, PA-C

## 2024-02-04 NOTE — Progress Notes (Addendum)
  Cardiology Office Note   Date:  02/04/2024  ID:  Angel Frederick, DOB Feb 03, 1991, MRN [id number] PCP: Lorren Greig PARAS, NP  [facility] Providers Cardiologist:  None - New patient  History of Present Illness Angel Frederick is a 33 y.o. female GERD, anxiety/depression who presents as a new patient for chest pain.  The patient denies tobacco use, alcohol use, or drug history.  She drinks coffee daily. Diet is healthy.  No significant family history of CAD.  The patient is here for chest pain that started in June. It has been consistent, but worse with inhalation and exhalation.  Chest pain has been coupled with worsening anxiety and panic attacks.  Patient has been feeling extreme stress from work as things have changed in the last year.  She has taken days off of work and had a panic attack last week.  She has been started on Prozac  as well as omeprazole .  Patient has no worsening chest pain on exertion.  She denies any recent fevers, chills, or other illnesses.  No lower leg edema, lightheadedness, dizziness, or palpitations.  She has no pain on palpation.  Studies Reviewed EKG Interpretation Date/Time:  Monday February 04 2024 08:15:37 EDT Ventricular Rate:  62 PR Interval:  144 QRS Duration:  80 QT Interval:  408 QTC Calculation: 414 R Axis:   60  Text Interpretation: Normal sinus rhythm with sinus arrhythmia Normal ECG When compared with ECG of 30-Jan-2024 16:19, No significant change was found Confirmed by Franchester, Audria Takeshita ([id number]) on 02/04/2024 8:29:54 AM    Physical Exam VS:  BP (!) 102/58 (BP Location: Left Arm, Patient Position: Sitting)   Pulse 62   Ht 5' 5 (1.651 m)   Wt 130 lb 3.2 oz (59.1 kg)   LMP 01/30/2024 (Exact Date)   SpO2 100%   BMI 21.67 kg/m        Wt Readings from Last 3 Encounters:  02/04/24 130 lb 3.2 oz (59.1 kg)  01/30/24 131 lb 6.4 oz (59.6 kg)  03/06/23 130 lb (59 kg)    GEN: Well nourished, well developed in no acute distress NECK: No  JVD; No carotid bruits CARDIAC: RRR, no murmurs, rubs, gallops RESPIRATORY:  Clear to auscultation without rales, wheezing or rhonchi  ABDOMEN: Soft, non-tender, non-distended EXTREMITIES:  No edema; No deformity   ASSESSMENT AND PLAN  Atypical chest pain Shortness of breath Patient reports constant chest pain since June described as a tightness that is worse with inhaling and exhaling.  She feels chest tightness is associated with increased anxiety from work.  She recently started taking Prozac  and a PPI.  She is also had panic attacks.  Patient also reports shortness of breath associated with the chest pain.  Suspect chest pain is mostly from anxiety.  I will check a cardiac CTA and echocardiogram.  I will risk stratify with lipid panel and A1c.  I will also check a TSH, CRP, sed rate.  Can follow-up in 4 to 6 weeks.       Dispo: Follow-up in 4 to 6 weeks  Signed, Takeela Peil VEAR Franchester, PA-C

## 2024-02-05 ENCOUNTER — Ambulatory Visit: Payer: Self-pay | Admitting: Medical

## 2024-02-05 LAB — TSH: TSH: 1.11 u[IU]/mL (ref 0.450–4.500)

## 2024-02-05 LAB — HEMOGLOBIN A1C
Est. average glucose Bld gHb Est-mCnc: 114 mg/dL
Hgb A1c MFr Bld: 5.6 % (ref 4.8–5.6)

## 2024-02-05 LAB — SEDIMENTATION RATE: Sed Rate: 2 mm/h (ref 0–32)

## 2024-02-05 LAB — C-REACTIVE PROTEIN: CRP: <1 mg/L (ref 0–10)

## 2024-02-05 LAB — BASIC METABOLIC PANEL WITH GFR
BUN/Creatinine Ratio: 14 (ref 9–23)
BUN: 12 mg/dL (ref 6–20)
CO2: 22 mmol/L (ref 20–29)
Calcium: 9.2 mg/dL (ref 8.7–10.2)
Chloride: 102 mmol/L (ref 96–106)
Creatinine, Ser: 0.88 mg/dL (ref 0.57–1.00)
Glucose: 87 mg/dL (ref 70–99)
Potassium: 4.3 mmol/L (ref 3.5–5.2)
Sodium: 136 mmol/L (ref 134–144)
eGFR: 88 mL/min/1.73 (ref >59)

## 2024-02-05 LAB — LDL CHOLESTEROL, DIRECT: LDL Direct: 119 mg/dL — ABNORMAL HIGH (ref 0–99)

## 2024-02-13 ENCOUNTER — Other Ambulatory Visit

## 2024-02-26 ENCOUNTER — Encounter (HOSPITAL_COMMUNITY): Payer: Self-pay

## 2024-02-27 ENCOUNTER — Telehealth (HOSPITAL_COMMUNITY): Payer: Self-pay | Admitting: Emergency Medicine

## 2024-02-27 NOTE — Telephone Encounter (Signed)
 Attempted to call patient regarding upcoming cardiac CT appointment. Left message on voicemail with name and callback number Rockwell Alexandria RN Navigator Cardiac Imaging [facility] [phone] Office [phone] Cell

## 2024-02-28 ENCOUNTER — Ambulatory Visit: Admission: RE | Admit: 2024-02-28 | Source: Ambulatory Visit

## 2024-02-29 ENCOUNTER — Ambulatory Visit (INDEPENDENT_AMBULATORY_CARE_PROVIDER_SITE_OTHER): Admitting: Family

## 2024-02-29 VITALS — BP 124/87 | HR 85 | Temp 98.5°F | Resp 16 | Wt 126.0 lb

## 2024-02-29 DIAGNOSIS — R630 Anorexia: Secondary | ICD-10-CM

## 2024-02-29 DIAGNOSIS — F909 Attention-deficit hyperactivity disorder, unspecified type: Secondary | ICD-10-CM

## 2024-02-29 DIAGNOSIS — Z0289 Encounter for other administrative examinations: Secondary | ICD-10-CM | POA: Diagnosis not present

## 2024-02-29 DIAGNOSIS — F32A Depression, unspecified: Secondary | ICD-10-CM

## 2024-02-29 DIAGNOSIS — F419 Anxiety disorder, unspecified: Secondary | ICD-10-CM

## 2024-02-29 NOTE — Progress Notes (Signed)
 Patient ID: Angel Frederick, female    DOB: August 18, 1990  MRN: [id number]  CC: Chronic Conditions Follow-Up  Subjective: Angel Frederick is a 33 y.o. female who presents for chronic conditions follow-up.  Her concerns today include:  States feeling improved since taking Fluoxetine . States she is established with Psychiatry who recently prescribed Adderall. States she noticed decreased appetite when Adderall was added to Fluoxetine  regimen. States Psychiatry told her to begin taking Addreall every other day to increase appetite which hasn't helped. She would like to see an appetite specialist. 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 She denies thoughts of self-harm, suicidal ideations, homicidal ideations.  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     cyclobenzaprine  (FLEXERIL ) 5 MG tablet Take 1 tablet (5 mg total) by mouth at bedtime as needed. 90 tablet 0   FLUoxetine  (PROZAC ) 10 MG capsule Take 1 capsule (10 mg total) by mouth daily. 90 capsule 0   ibuprofen  (ADVIL ) 600 MG tablet Take 1 tablet (600 mg total) by mouth every 8 (eight) hours as needed. 30 tablet 0   naproxen  (NAPROSYN ) 500 MG tablet Take 1 tablet (500 mg total) by mouth 2 (two) times daily with a meal. 30 tablet 0   omeprazole  (PRILOSEC) 20 MG capsule Take 1 capsule (20 mg total) by mouth daily. 90 capsule 0   metoprolol tartrate (LOPRESSOR) 50 MG tablet TAKE 1 TABLET 2 HR PRIOR TO CARDIAC PROCEDURE 1 tablet 0   No current facility-administered medications on file prior to  visit.    No Known Allergies  Social History   Socioeconomic History   Marital status: Single    Spouse name: Not on file   Number of children: Not on file   Years of education: Not on file   Highest education level: Associate degree: academic program  Occupational History   Not on file  Tobacco Use   Smoking status: Never   Smokeless tobacco: Never  Vaping Use   Vaping status: Never Used  Substance and Sexual Activity   Alcohol use: Never   Drug use: Never   Sexual activity: Yes    Birth control/protection: Condom  Other Topics Concern   Not on file  Social History Narrative   Not on file   Social Drivers of Health   Financial Resource Strain: Patient Declined (01/30/2024)   Overall Financial Resource Strain (CARDIA)    Difficulty of Paying Living Expenses: Patient declined  Food Insecurity: Patient Declined (01/30/2024)   Hunger Vital Sign    Worried About Running Out of Food in the Last Year: Patient declined    Ran Out of Food in the Last Year: Patient declined  Transportation Needs: No Transportation Needs (01/30/2024)   PRAPARE - Administrator, Civil Service (Medical): No    Lack of Transportation (Non-Medical): No  Physical Activity: Inactive (01/30/2024)   Exercise Vital Sign    Days of Exercise per Week: 0 days    Minutes of Exercise per Session: Not on file  Stress: Stress Concern Present (01/30/2024)  [employer] of Occupational Health - Occupational Stress Questionnaire    Feeling of Stress: Very much  Social Connections: Socially Isolated (01/30/2024)   Social Connection and Isolation Panel    Frequency of Communication with Friends and Family: Three times a week    Frequency of Social Gatherings with Friends and Family: Once a week    Attends Religious Services: Never    Database administrator or Organizations: No    Attends Banker Meetings: Never    Marital Status: Never married  Intimate Partner Violence: Not At Risk  (01/30/2024)   Humiliation, Afraid, Rape, and Kick questionnaire    Fear of Current or Ex-Partner: No    Emotionally Abused: No    Physically Abused: No    Sexually Abused: No    Family History  Problem Relation Age of Onset   Healthy Mother    Prostate cancer Father     Past Surgical History:  Procedure Laterality Date   NO PAST SURGERIES      ROS: Review of Systems Negative except as stated above  PHYSICAL EXAM: BP 124/87   Pulse 85   Temp 98.5 F (36.9 C) (Oral)   Resp 16   Wt 126 lb (57.2 kg)   LMP 01/30/2024 (Exact Date)   SpO2 99%   BMI 20.97 kg/m   Physical Exam HENT:     Head: Normocephalic and atraumatic.     Nose: Nose normal.     Mouth/Throat:     Mouth: Mucous membranes are moist.     Pharynx: Oropharynx is clear.  Eyes:     Extraocular Movements: Extraocular movements intact.     Conjunctiva/sclera: Conjunctivae normal.     Pupils: Pupils are equal, round, and reactive to light.  Cardiovascular:     Rate and Rhythm: Normal rate and regular rhythm.     Pulses: Normal pulses.     Heart sounds: Normal heart sounds.  Pulmonary:     Effort: Pulmonary effort is normal.     Breath sounds: Normal breath sounds.  Musculoskeletal:        General: Normal range of motion.     Cervical back: Normal range of motion and neck supple.  Neurological:     General: No focal deficit present.     Mental Status: She is alert and oriented to person, place, and time.  Psychiatric:        Mood and Affect: Mood normal.        Behavior: Behavior normal.     ASSESSMENT AND PLAN: 1. Encounter for completion of form with patient (Primary) 2. Anxiety and depression 3. Attention deficit hyperactivity disorder (ADHD), unspecified ADHD type - Patient denies thoughts of self-harm, suicidal ideations, homicidal ideations. - Continue present management. No refills needed as of present.  - Patient established with Psychiatry. Defer FMLA completion to the same.  -  Follow-up with primary provider as scheduled.   4. Decreased appetite - Referral to Medical Weight Management for evaluation/management. - Amb Ref to Medical Weight Management   Patient was given the opportunity to ask questions.  Patient verbalized understanding of the plan and was able to repeat key elements of the plan. Patient was given clear instructions to go to Emergency Department or return to medical center if symptoms don't improve, worsen, or new problems develop.The patient verbalized understanding.   Orders Placed This Encounter  Procedures   Amb Ref to Medical Weight Management    Return for Follow-up as needed.  Melisha Eggleton  JINNY Drones, NP

## 2024-03-10 ENCOUNTER — Ambulatory Visit: Attending: Medical | Admitting: Medical

## 2024-03-10 NOTE — Progress Notes (Deleted)
  Cardiology Office Note   Date:  03/10/2024  ID:  MARYFER TAUZIN, DOB 1990-11-08, MRN [id number] PCP: Lorren Greig PARAS, NP  [facility] Providers Cardiologist:  None { Click to update primary MD,subspecialty MD or APP then REFRESH:1}    History of Present Illness CAMBRY SPAMPINATO is a 33 y.o. female with a h/o GERD, anxiety/depression who presents as a follow-up for chest pain.   The patient was seen as a new patient 02/04/24  ROS: ***  Studies Reviewed      *** Risk Assessment/Calculations {Does this patient have ATRIAL FIBRILLATION?:[phone]} No BP recorded.  {Refresh Note OR Click here to enter BP  :1}***       Physical Exam VS:  There were no vitals taken for this visit.       Wt Readings from Last 3 Encounters:  02/29/24 126 lb (57.2 kg)  02/04/24 130 lb 3.2 oz (59.1 kg)  01/30/24 131 lb 6.4 oz (59.6 kg)    GEN: Well nourished, well developed in no acute distress NECK: No JVD; No carotid bruits CARDIAC: ***RRR, no murmurs, rubs, gallops RESPIRATORY:  Clear to auscultation without rales, wheezing or rhonchi  ABDOMEN: Soft, non-tender, non-distended EXTREMITIES:  No edema; No deformity   ASSESSMENT AND PLAN ***    {Are you ordering a CV Procedure (e.g. stress test, cath, DCCV, TEE, etc)?   Press F2        :[id number]}  Dispo: ***  Signed, Tanise Russman VEAR Fishman, PA-C

## 2024-03-19 ENCOUNTER — Ambulatory Visit: Admitting: Family

## 2024-03-19 ENCOUNTER — Encounter (INDEPENDENT_AMBULATORY_CARE_PROVIDER_SITE_OTHER): Payer: Self-pay

## 2024-03-24 ENCOUNTER — Ambulatory Visit

## 2024-04-08 ENCOUNTER — Encounter (HOSPITAL_COMMUNITY): Payer: Self-pay

## 2024-04-09 ENCOUNTER — Telehealth (HOSPITAL_COMMUNITY): Payer: Self-pay | Admitting: Emergency Medicine

## 2024-04-09 NOTE — Telephone Encounter (Signed)
 Reaching out to patient to offer assistance regarding upcoming cardiac imaging study; pt verbalizes understanding of appt date/time, parking situation and where to check in, pre-test NPO status and medications ordered, and verified current allergies; name and call back number provided for further questions should they arise Rockwell Alexandria RN Navigator Cardiac Imaging [facility] [phone] office [phone] cell

## 2024-04-10 ENCOUNTER — Encounter (HOSPITAL_COMMUNITY): Payer: Self-pay

## 2024-04-10 ENCOUNTER — Ambulatory Visit: Admission: RE | Admit: 2024-04-10 | Source: Ambulatory Visit

## 2024-04-17 ENCOUNTER — Encounter: Payer: Self-pay | Admitting: Obstetrics and Gynecology

## 2024-04-17 ENCOUNTER — Encounter: Admitting: Obstetrics and Gynecology

## 2024-04-18 NOTE — Progress Notes (Signed)
 Patient did not keep her new GYN appointment for 04/17/2024.  Bebe Izell Raddle MD Attending Center for [employer] [facility])

## 2024-05-02 ENCOUNTER — Ambulatory Visit

## 2024-06-18 ENCOUNTER — Encounter: Admitting: Family
